# Patient Record
Sex: Male | Born: 1960 | Race: Black or African American | Hispanic: No | Marital: Single
Health system: Southern US, Community
[De-identification: ages and names within clinical notes are randomized; demographics above are authoritative.]

## PROBLEM LIST (undated history)

## (undated) DIAGNOSIS — F209 Schizophrenia, unspecified: Secondary | ICD-10-CM

## (undated) DIAGNOSIS — I10 Essential (primary) hypertension: Secondary | ICD-10-CM

## (undated) HISTORY — PX: HERNIA REPAIR: SHX51

## (undated) HISTORY — PX: APPENDECTOMY: SHX54

---

## 2011-04-13 ENCOUNTER — Encounter (HOSPITAL_COMMUNITY): Payer: Self-pay | Admitting: Pharmacy Technician

## 2011-04-14 NOTE — Progress Notes (Signed)
Called Dr. Yetta Barre office, spoke with Erie Noe, requested orders for surgery.

## 2011-04-15 ENCOUNTER — Encounter (HOSPITAL_COMMUNITY): Admission: RE | Payer: Self-pay | Source: Ambulatory Visit

## 2011-04-15 ENCOUNTER — Ambulatory Visit (HOSPITAL_COMMUNITY)
Admission: RE | Admit: 2011-04-15 | Payer: Worker's Compensation | Source: Ambulatory Visit | Admitting: Neurological Surgery

## 2011-04-15 SURGERY — POSTERIOR LUMBAR FUSION 2 LEVEL
Anesthesia: General | Site: Back

## 2011-07-20 NOTE — Progress Notes (Signed)
LM for Erie Noe re our office unable to contact pt since early April. Requested orders.

## 2011-07-21 ENCOUNTER — Encounter (HOSPITAL_COMMUNITY): Admission: RE | Payer: Self-pay | Source: Ambulatory Visit

## 2011-07-21 ENCOUNTER — Ambulatory Visit (HOSPITAL_COMMUNITY)
Admission: RE | Admit: 2011-07-21 | Payer: Worker's Compensation | Source: Ambulatory Visit | Admitting: Neurological Surgery

## 2011-07-21 SURGERY — POSTERIOR LUMBAR FUSION 2 LEVEL
Anesthesia: General | Site: Back

## 2013-01-31 ENCOUNTER — Other Ambulatory Visit: Payer: Self-pay | Admitting: Neurological Surgery

## 2013-01-31 DIAGNOSIS — M542 Cervicalgia: Secondary | ICD-10-CM

## 2013-01-31 DIAGNOSIS — M549 Dorsalgia, unspecified: Secondary | ICD-10-CM

## 2013-02-10 ENCOUNTER — Other Ambulatory Visit: Payer: Self-pay

## 2013-05-20 ENCOUNTER — Ambulatory Visit
Admission: RE | Admit: 2013-05-20 | Discharge: 2013-05-20 | Disposition: A | Discharge status: Home / Self Care | Payer: Worker's Compensation | Source: Ambulatory Visit | Attending: Neurological Surgery | Admitting: Neurological Surgery

## 2013-05-20 ENCOUNTER — Ambulatory Visit
Admission: RE | Admit: 2013-05-20 | Discharge: 2013-05-20 | Disposition: A | Discharge status: Home / Self Care | Payer: Self-pay | Source: Ambulatory Visit | Attending: Neurological Surgery | Admitting: Neurological Surgery

## 2013-05-20 DIAGNOSIS — M549 Dorsalgia, unspecified: Secondary | ICD-10-CM

## 2013-05-20 DIAGNOSIS — M542 Cervicalgia: Secondary | ICD-10-CM

## 2018-10-30 DIAGNOSIS — K3532 Acute appendicitis with perforation and localized peritonitis, without abscess: Secondary | ICD-10-CM

## 2018-10-30 DIAGNOSIS — I252 Old myocardial infarction: Secondary | ICD-10-CM

## 2019-02-28 ENCOUNTER — Inpatient Hospital Stay (HOSPITAL_COMMUNITY)
Admission: EM | Admit: 2019-02-28 | Discharge: 2019-03-22 | DRG: 663 | Disposition: A | Discharge status: Other Acute Inpatient Hospital | Payer: Self-pay | Attending: Internal Medicine | Admitting: Internal Medicine

## 2019-02-28 ENCOUNTER — Encounter (HOSPITAL_COMMUNITY): Payer: Self-pay | Admitting: *Deleted

## 2019-02-28 ENCOUNTER — Emergency Department (HOSPITAL_COMMUNITY): Payer: Self-pay

## 2019-02-28 ENCOUNTER — Other Ambulatory Visit: Payer: Self-pay

## 2019-02-28 DIAGNOSIS — F29 Unspecified psychosis not due to a substance or known physiological condition: Secondary | ICD-10-CM | POA: Diagnosis present

## 2019-02-28 DIAGNOSIS — K5900 Constipation, unspecified: Secondary | ICD-10-CM | POA: Diagnosis present

## 2019-02-28 DIAGNOSIS — T1490XA Injury, unspecified, initial encounter: Secondary | ICD-10-CM

## 2019-02-28 DIAGNOSIS — Z7289 Other problems related to lifestyle: Secondary | ICD-10-CM

## 2019-02-28 DIAGNOSIS — F200 Paranoid schizophrenia: Secondary | ICD-10-CM | POA: Diagnosis present

## 2019-02-28 DIAGNOSIS — E872 Acidosis: Secondary | ICD-10-CM | POA: Diagnosis present

## 2019-02-28 DIAGNOSIS — N138 Other obstructive and reflux uropathy: Secondary | ICD-10-CM | POA: Diagnosis present

## 2019-02-28 DIAGNOSIS — S31119A Laceration without foreign body of abdominal wall, unspecified quadrant without penetration into peritoneal cavity, initial encounter: Secondary | ICD-10-CM | POA: Diagnosis present

## 2019-02-28 DIAGNOSIS — N320 Bladder-neck obstruction: Secondary | ICD-10-CM | POA: Diagnosis present

## 2019-02-28 DIAGNOSIS — R339 Retention of urine, unspecified: Secondary | ICD-10-CM | POA: Diagnosis present

## 2019-02-28 DIAGNOSIS — K5730 Diverticulosis of large intestine without perforation or abscess without bleeding: Secondary | ICD-10-CM | POA: Diagnosis present

## 2019-02-28 DIAGNOSIS — F319 Bipolar disorder, unspecified: Secondary | ICD-10-CM | POA: Diagnosis present

## 2019-02-28 DIAGNOSIS — I10 Essential (primary) hypertension: Secondary | ICD-10-CM | POA: Diagnosis present

## 2019-02-28 DIAGNOSIS — N390 Urinary tract infection, site not specified: Secondary | ICD-10-CM | POA: Diagnosis present

## 2019-02-28 DIAGNOSIS — R21 Rash and other nonspecific skin eruption: Secondary | ICD-10-CM | POA: Diagnosis not present

## 2019-02-28 DIAGNOSIS — E162 Hypoglycemia, unspecified: Secondary | ICD-10-CM

## 2019-02-28 DIAGNOSIS — F17210 Nicotine dependence, cigarettes, uncomplicated: Secondary | ICD-10-CM | POA: Diagnosis present

## 2019-02-28 DIAGNOSIS — N401 Enlarged prostate with lower urinary tract symptoms: Secondary | ICD-10-CM | POA: Diagnosis present

## 2019-02-28 DIAGNOSIS — R451 Restlessness and agitation: Secondary | ICD-10-CM | POA: Diagnosis not present

## 2019-02-28 DIAGNOSIS — S3121XA Laceration without foreign body of penis, initial encounter: Secondary | ICD-10-CM | POA: Diagnosis present

## 2019-02-28 DIAGNOSIS — N179 Acute kidney failure, unspecified: Principal | ICD-10-CM | POA: Diagnosis present

## 2019-02-28 DIAGNOSIS — T797XXA Traumatic subcutaneous emphysema, initial encounter: Secondary | ICD-10-CM | POA: Diagnosis present

## 2019-02-28 DIAGNOSIS — X789XXA Intentional self-harm by unspecified sharp object, initial encounter: Secondary | ICD-10-CM | POA: Diagnosis present

## 2019-02-28 DIAGNOSIS — Z79899 Other long term (current) drug therapy: Secondary | ICD-10-CM

## 2019-02-28 DIAGNOSIS — K449 Diaphragmatic hernia without obstruction or gangrene: Secondary | ICD-10-CM | POA: Diagnosis present

## 2019-02-28 DIAGNOSIS — Z9049 Acquired absence of other specified parts of digestive tract: Secondary | ICD-10-CM

## 2019-02-28 DIAGNOSIS — D696 Thrombocytopenia, unspecified: Secondary | ICD-10-CM

## 2019-02-28 DIAGNOSIS — T148XXA Other injury of unspecified body region, initial encounter: Secondary | ICD-10-CM | POA: Diagnosis present

## 2019-02-28 DIAGNOSIS — Z20822 Contact with and (suspected) exposure to covid-19: Secondary | ICD-10-CM | POA: Diagnosis present

## 2019-02-28 DIAGNOSIS — R52 Pain, unspecified: Secondary | ICD-10-CM

## 2019-02-28 DIAGNOSIS — R109 Unspecified abdominal pain: Secondary | ICD-10-CM

## 2019-02-28 DIAGNOSIS — I700 Atherosclerosis of aorta: Secondary | ICD-10-CM | POA: Diagnosis present

## 2019-02-28 HISTORY — DX: Schizophrenia, unspecified: F20.9

## 2019-02-28 HISTORY — DX: Essential (primary) hypertension: I10

## 2019-02-28 LAB — COMPREHENSIVE METABOLIC PANEL
ALT: 25 U/L (ref 0–44)
AST: 39 U/L (ref 15–41)
Albumin: 3.6 g/dL (ref 3.5–5.0)
Alkaline Phosphatase: 42 U/L (ref 38–126)
Anion gap: 19 — ABNORMAL HIGH (ref 5–15)
BUN: 68 mg/dL — ABNORMAL HIGH (ref 6–20)
CO2: 12 mmol/L — ABNORMAL LOW (ref 22–32)
Calcium: 9.2 mg/dL (ref 8.9–10.3)
Chloride: 109 mmol/L (ref 98–111)
Creatinine, Ser: 9.46 mg/dL — ABNORMAL HIGH (ref 0.61–1.24)
GFR calc Af Amer: 6 mL/min — ABNORMAL LOW (ref >60)
GFR calc non Af Amer: 5 mL/min — ABNORMAL LOW (ref >60)
Glucose, Bld: 66 mg/dL — ABNORMAL LOW (ref 70–99)
Potassium: 4.2 mmol/L (ref 3.5–5.1)
Sodium: 140 mmol/L (ref 135–145)
Total Bilirubin: 0.7 mg/dL (ref 0.3–1.2)
Total Protein: 6.8 g/dL (ref 6.5–8.1)

## 2019-02-28 LAB — URINALYSIS, ROUTINE W REFLEX MICROSCOPIC
Bilirubin Urine: NEGATIVE
Glucose, UA: NEGATIVE mg/dL
Ketones, ur: NEGATIVE mg/dL
Leukocytes,Ua: NEGATIVE
Nitrite: NEGATIVE
Protein, ur: NEGATIVE mg/dL
Specific Gravity, Urine: 1.013 (ref 1.005–1.030)
pH: 5 (ref 5.0–8.0)

## 2019-02-28 LAB — CBC
HCT: 44.2 % (ref 39.0–52.0)
Hemoglobin: 14.4 g/dL (ref 13.0–17.0)
MCH: 27.5 pg (ref 26.0–34.0)
MCHC: 32.6 g/dL (ref 30.0–36.0)
MCV: 84.5 fL (ref 80.0–100.0)
Platelets: 131 10*3/uL — ABNORMAL LOW (ref 150–400)
RBC: 5.23 MIL/uL (ref 4.22–5.81)
RDW: 14.6 % (ref 11.5–15.5)
WBC: 6.3 10*3/uL (ref 4.0–10.5)
nRBC: 0 % (ref 0.0–0.2)

## 2019-02-28 LAB — RESPIRATORY PANEL BY RT PCR (FLU A&B, COVID)
Influenza A by PCR: NEGATIVE
Influenza B by PCR: NEGATIVE
SARS Coronavirus 2 by RT PCR: NEGATIVE

## 2019-02-28 LAB — PROTIME-INR
INR: 1.2 (ref 0.8–1.2)
Prothrombin Time: 14.9 seconds (ref 11.4–15.2)

## 2019-02-28 LAB — SAMPLE TO BLOOD BANK

## 2019-02-28 LAB — ACETAMINOPHEN LEVEL: Acetaminophen (Tylenol), Serum: <10 ug/mL — ABNORMAL LOW (ref 10–30)

## 2019-02-28 LAB — ETHANOL: Alcohol, Ethyl (B): <10 mg/dL (ref <10)

## 2019-02-28 LAB — CDS SEROLOGY

## 2019-02-28 LAB — SALICYLATE LEVEL: Salicylate Lvl: <7 mg/dL — ABNORMAL LOW (ref 7.0–30.0)

## 2019-02-28 MED ORDER — ACETAMINOPHEN 325 MG PO TABS
650.0000 mg | ORAL_TABLET | Freq: Four times a day (QID) | ORAL | Status: DC | PRN
  Administered 2019-03-01 – 2019-03-15 (×3): 650 mg via ORAL
  Filled 2019-02-28 (×3): qty 2

## 2019-02-28 MED ORDER — SODIUM CHLORIDE 0.9 % IV SOLN
INTRAVENOUS | Status: AC | PRN
  Administered 2019-02-28 (×2): 1000 mL via INTRAVENOUS

## 2019-02-28 MED ORDER — CEPHALEXIN 250 MG PO CAPS
500.0000 mg | ORAL_CAPSULE | Freq: Four times a day (QID) | ORAL | Status: DC
  Administered 2019-02-28: 21:00:00 500 mg via ORAL
  Filled 2019-02-28: qty 2

## 2019-02-28 MED ORDER — ACETAMINOPHEN 650 MG RE SUPP: 650.0000 mg | Freq: Four times a day (QID) | RECTAL | Status: DC | PRN

## 2019-02-28 MED ORDER — FENTANYL CITRATE (PF) 100 MCG/2ML IJ SOLN
100.0000 ug | Freq: Once | INTRAMUSCULAR | Status: AC
  Administered 2019-02-28: 100 ug via INTRAVENOUS
  Filled 2019-02-28: qty 2

## 2019-02-28 MED ORDER — SODIUM CHLORIDE 0.9 % IV SOLN: INTRAVENOUS | Status: AC

## 2019-02-28 MED ORDER — FENTANYL CITRATE (PF) 100 MCG/2ML IJ SOLN
INTRAMUSCULAR | Status: AC
  Filled 2019-02-28: qty 2

## 2019-02-28 MED ORDER — FENTANYL CITRATE (PF) 100 MCG/2ML IJ SOLN
100.0000 ug | Freq: Once | INTRAMUSCULAR | Status: AC
  Administered 2019-02-28: 100 ug via INTRAVENOUS

## 2019-02-28 MED ORDER — BUPIVACAINE HCL (PF) 0.5 % IJ SOLN
10.0000 mL | Freq: Once | INTRAMUSCULAR | Status: AC
  Administered 2019-02-28: 10 mL
  Filled 2019-02-28: qty 10

## 2019-02-28 MED ORDER — IOHEXOL 300 MG/ML  SOLN
100.0000 mL | Freq: Once | INTRAMUSCULAR | Status: AC | PRN
  Administered 2019-02-28: 100 mL via INTRAVENOUS

## 2019-02-28 MED ORDER — SODIUM CHLORIDE 0.9 % IV SOLN
1.0000 g | Freq: Every day | INTRAVENOUS | Status: DC
  Administered 2019-03-01 – 2019-03-02 (×3): 1 g via INTRAVENOUS
  Filled 2019-02-28 (×4): qty 10

## 2019-02-28 MED ORDER — TETANUS-DIPHTH-ACELL PERTUSSIS 5-2.5-18.5 LF-MCG/0.5 IM SUSP
0.5000 mL | Freq: Once | INTRAMUSCULAR | Status: AC
  Administered 2019-02-28: 0.5 mL via INTRAMUSCULAR
  Filled 2019-02-28: qty 0.5

## 2019-02-28 MED ORDER — LIDOCAINE-EPINEPHRINE (PF) 2 %-1:200000 IJ SOLN
10.0000 mL | Freq: Once | INTRAMUSCULAR | Status: AC
  Administered 2019-02-28: 10 mL
  Filled 2019-02-28: qty 20

## 2019-02-28 NOTE — ED Notes (Signed)
Portable xrays chest and abd
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
## 2019-02-28 NOTE — ED Notes (Signed)
Initial bp was  116/78

## 2019-02-28 NOTE — ED Notes (Signed)
Pt c/o the pain from the catheter
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
## 2019-02-28 NOTE — Discharge Instructions (Signed)
-  Keflex 500 mg 4 times daily for 5 days 
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
## 2019-02-28 NOTE — ED Notes (Signed)
To ct
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
## 2019-02-28 NOTE — ED Notes (Signed)
Trauma surgeon just arrived

## 2019-02-28 NOTE — ED Provider Notes (Signed)
 ..  Laceration Repair  Date/Time: 02/28/2019 7:20 PM Performed by: Sena Hitch, Student-PA Authorized by: Anselm Pancoast, PA-C   Consent:    Consent obtained:  Verbal   Consent given by:  Patient   Risks discussed:  Infection, pain, vascular damage, nerve damage, need for additional repair, poor wound healing and poor cosmetic result Anesthesia (see MAR for exact dosages):    Anesthesia method:  Local infiltration   Local anesthetic:  Bupivacaine 0.5% w/o epi and lidocaine 2% WITH epi Laceration details:    Location:  Pelvis   Pelvis location:  Pelvis   Length (cm):  7 Repair type:    Repair type:  Complex Exploration:    Wound exploration: entire depth of wound probed and visualized   Treatment:    Area cleansed with:  Betadine and saline   Amount of cleaning:  Extensive   Irrigation solution:  Sterile saline   Irrigation method:  Syringe   Debridement:  Moderate Skin repair:    Repair method:  Sutures   Suture size:  3-0   Suture material:  Plain gut   Suture technique:  Simple interrupted   Number of sutures:  3 Approximation:    Approximation:  Loose Post-procedure details:    Dressing:  Non-adherent dressing   Patient tolerance of procedure:  Tolerated well, no immediate complications       Adam Castro 02/28/19 2012    Benjiman Core, MD 02/28/19 2332

## 2019-02-28 NOTE — ED Notes (Signed)
Pt taken to CT with rn's and physicians.
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
## 2019-03-01 ENCOUNTER — Encounter (HOSPITAL_COMMUNITY): Payer: Self-pay | Admitting: Internal Medicine

## 2019-03-01 DIAGNOSIS — D696 Thrombocytopenia, unspecified: Secondary | ICD-10-CM

## 2019-03-01 LAB — COMPREHENSIVE METABOLIC PANEL
ALT: 23 U/L (ref 0–44)
AST: 32 U/L (ref 15–41)
Albumin: 3.5 g/dL (ref 3.5–5.0)
Alkaline Phosphatase: 42 U/L (ref 38–126)
Anion gap: 15 (ref 5–15)
BUN: 59 mg/dL — ABNORMAL HIGH (ref 6–20)
CO2: 18 mmol/L — ABNORMAL LOW (ref 22–32)
Calcium: 9.4 mg/dL (ref 8.9–10.3)
Chloride: 108 mmol/L (ref 98–111)
Creatinine, Ser: 4.57 mg/dL — ABNORMAL HIGH (ref 0.61–1.24)
GFR calc Af Amer: 15 mL/min — ABNORMAL LOW (ref >60)
GFR calc non Af Amer: 13 mL/min — ABNORMAL LOW (ref >60)
Glucose, Bld: 77 mg/dL (ref 70–99)
Potassium: 3.5 mmol/L (ref 3.5–5.1)
Sodium: 141 mmol/L (ref 135–145)
Total Bilirubin: 0.7 mg/dL (ref 0.3–1.2)
Total Protein: 6.8 g/dL (ref 6.5–8.1)

## 2019-03-01 LAB — RAPID URINE DRUG SCREEN, HOSP PERFORMED
Amphetamines: POSITIVE — AB
Barbiturates: NOT DETECTED
Benzodiazepines: NOT DETECTED
Cocaine: NOT DETECTED
Opiates: NOT DETECTED
Tetrahydrocannabinol: NOT DETECTED

## 2019-03-01 LAB — CBC
HCT: 45 % (ref 39.0–52.0)
Hemoglobin: 14.3 g/dL (ref 13.0–17.0)
MCH: 27.6 pg (ref 26.0–34.0)
MCHC: 31.8 g/dL (ref 30.0–36.0)
MCV: 86.7 fL (ref 80.0–100.0)
Platelets: 123 10*3/uL — ABNORMAL LOW (ref 150–400)
RBC: 5.19 MIL/uL (ref 4.22–5.81)
RDW: 14.7 % (ref 11.5–15.5)
WBC: 6.4 10*3/uL (ref 4.0–10.5)
nRBC: 0 % (ref 0.0–0.2)

## 2019-03-01 LAB — PROTEIN / CREATININE RATIO, URINE
Creatinine, Urine: 86.95 mg/dL
Protein Creatinine Ratio: 0.14 mg/mg{Cre} (ref 0.00–0.15)
Total Protein, Urine: 12 mg/dL

## 2019-03-01 LAB — HIV ANTIBODY (ROUTINE TESTING W REFLEX): HIV Screen 4th Generation wRfx: NONREACTIVE

## 2019-03-01 LAB — SODIUM, URINE, RANDOM: Sodium, Ur: 104 mmol/L

## 2019-03-01 MED ORDER — DIPHENHYDRAMINE HCL 50 MG/ML IJ SOLN
25.0000 mg | Freq: Once | INTRAMUSCULAR | Status: AC
  Administered 2019-03-01: 25 mg via INTRAVENOUS

## 2019-03-01 MED ORDER — HALOPERIDOL LACTATE 5 MG/ML IJ SOLN
5.0000 mg | Freq: Once | INTRAMUSCULAR | Status: AC
  Administered 2019-03-06: 5 mg via INTRAVENOUS
  Filled 2019-03-01: qty 1

## 2019-03-01 MED ORDER — DIPHENHYDRAMINE HCL 50 MG/ML IJ SOLN
INTRAMUSCULAR | Status: AC
  Filled 2019-03-01: qty 1

## 2019-03-01 MED ORDER — HYDROCODONE-ACETAMINOPHEN 5-325 MG PO TABS
1.0000 | ORAL_TABLET | Freq: Four times a day (QID) | ORAL | Status: DC | PRN
  Administered 2019-03-01 – 2019-03-02 (×3): 2 via ORAL
  Filled 2019-03-01 (×3): qty 2

## 2019-03-01 MED ORDER — LORAZEPAM 2 MG/ML IJ SOLN
INTRAMUSCULAR | Status: AC
  Filled 2019-03-01: qty 1

## 2019-03-01 MED ORDER — HALOPERIDOL LACTATE 5 MG/ML IJ SOLN
5.0000 mg | Freq: Once | INTRAMUSCULAR | Status: AC
  Administered 2019-03-01: 5 mg via INTRAVENOUS
  Filled 2019-03-01: qty 1

## 2019-03-01 MED ORDER — CHLORHEXIDINE GLUCONATE CLOTH 2 % EX PADS
6.0000 | MEDICATED_PAD | Freq: Every day | CUTANEOUS | Status: DC
  Administered 2019-03-02 – 2019-03-15 (×11): 6 via TOPICAL

## 2019-03-01 MED ORDER — LORAZEPAM 2 MG/ML IJ SOLN
2.0000 mg | Freq: Once | INTRAMUSCULAR | Status: AC
  Administered 2019-03-01: 2 mg via INTRAVENOUS

## 2019-03-01 MED ORDER — STERILE WATER FOR INJECTION IV SOLN
INTRAVENOUS | Status: DC
  Filled 2019-03-01 (×2): qty 850

## 2019-03-01 NOTE — ED Notes (Signed)
Called staffing to see if sitter available, no sitter in house.

## 2019-03-01 NOTE — ED Notes (Signed)
Pt awake and more alert and upright eating lunch.  Report has been called to 6E and waiting designation of where he will be admitted.
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
## 2019-03-01 NOTE — ED Notes (Signed)
Pt has remained drowsy and unable to stay awake to eat breakfast despite set up for him.  TTS now in process with pt remaining drowsy with poor concentration and slurred speech.  Able to redirect briefly and is calm.

## 2019-03-01 NOTE — ED Notes (Signed)
MD in with pt at this time.  Has dozed off while eating, but wakes for assessment.  Calm without gross agitation.

## 2019-03-01 NOTE — ED Notes (Signed)
Pt accepted medication willingly
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
## 2019-03-01 NOTE — ED Notes (Signed)
Paged admitting MD for extension order for violent restraints

## 2019-03-01 NOTE — ED Notes (Signed)
Pt hallucinating stating that he is seeing "bugs" on his penis and told this RN "to catch them". Told pt he did not have any bugs on him and he stated, "you guys just think I'm crazy." Pt also got agitated because he "had something dirty" in his IV and ended up pulling it out. Orders for meds requested

## 2019-03-01 NOTE — ED Notes (Addendum)
Restraints removed at this time.  Pt sleeping and able to move freely in bed.  Will continue to monitor for changes.
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
## 2019-03-01 NOTE — ED Notes (Signed)
Pt will continue to go to 6 E oer bed placement.

## 2019-03-01 NOTE — ED Notes (Signed)
PA at bedside repairing wound.

## 2019-03-02 LAB — BASIC METABOLIC PANEL
Anion gap: 9 (ref 5–15)
BUN: 37 mg/dL — ABNORMAL HIGH (ref 6–20)
CO2: 25 mmol/L (ref 22–32)
Calcium: 8.3 mg/dL — ABNORMAL LOW (ref 8.9–10.3)
Chloride: 102 mmol/L (ref 98–111)
Creatinine, Ser: 1.13 mg/dL (ref 0.61–1.24)
GFR calc Af Amer: >60 mL/min (ref >60)
GFR calc non Af Amer: >60 mL/min (ref >60)
Glucose, Bld: 113 mg/dL — ABNORMAL HIGH (ref 70–99)
Potassium: 3.5 mmol/L (ref 3.5–5.1)
Sodium: 136 mmol/L (ref 135–145)

## 2019-03-02 LAB — PROTEIN ELECTROPHORESIS, SERUM
A/G Ratio: 1 (ref 0.7–1.7)
Albumin ELP: 3.3 g/dL (ref 2.9–4.4)
Alpha-1-Globulin: 0.2 g/dL (ref 0.0–0.4)
Alpha-2-Globulin: 0.7 g/dL (ref 0.4–1.0)
Beta Globulin: 1.1 g/dL (ref 0.7–1.3)
Gamma Globulin: 1.3 g/dL (ref 0.4–1.8)
Globulin, Total: 3.3 g/dL (ref 2.2–3.9)
Total Protein ELP: 6.6 g/dL (ref 6.0–8.5)

## 2019-03-02 LAB — PSA, SERUM (SERIAL MONITOR): Prostate Specific Ag, Serum: 4 ng/mL (ref 0.0–4.0)

## 2019-03-02 LAB — CYTOLOGY - NON PAP

## 2019-03-02 MED ORDER — SENNOSIDES-DOCUSATE SODIUM 8.6-50 MG PO TABS
1.0000 | ORAL_TABLET | Freq: Every evening | ORAL | Status: DC | PRN
  Administered 2019-03-02: 2 via ORAL
  Filled 2019-03-02: qty 2

## 2019-03-02 MED ORDER — OXYCODONE HCL 5 MG PO TABS
10.0000 mg | ORAL_TABLET | ORAL | Status: DC | PRN
  Administered 2019-03-02 – 2019-03-03 (×6): 15 mg via ORAL
  Administered 2019-03-03: 10 mg via ORAL
  Administered 2019-03-04 – 2019-03-09 (×27): 15 mg via ORAL
  Administered 2019-03-09: 12:00:00 10 mg via ORAL
  Administered 2019-03-09 – 2019-03-11 (×12): 15 mg via ORAL
  Administered 2019-03-11: 10 mg via ORAL
  Administered 2019-03-11 – 2019-03-16 (×23): 15 mg via ORAL
  Administered 2019-03-16: 10 mg via ORAL
  Administered 2019-03-16 – 2019-03-17 (×6): 15 mg via ORAL
  Administered 2019-03-17: 10 mg via ORAL
  Administered 2019-03-17 – 2019-03-22 (×26): 15 mg via ORAL
  Filled 2019-03-02 (×26): qty 3
  Filled 2019-03-02 (×2): qty 2
  Filled 2019-03-02 (×41): qty 3
  Filled 2019-03-02: qty 2
  Filled 2019-03-02 (×35): qty 3
  Filled 2019-03-02: qty 2

## 2019-03-02 NOTE — Progress Notes (Signed)
Pt gave me permission to speak with sister about his condition. She stated that he does not have a history of any psych issues that she is aware of. She did state that she knew that he had a history of substance abuse, but has not seen him in many months, so is unsure what he is doing at present. Pt stated to me that he had not been urinating except very small amounts for about 4 days prior to coming in. He stated he quit drinking fluids because it was so difficult for him to urinate. Pt has been very appropriate today, alert and oriented as well. He complained of being constipated for a week prev to coming in. Paged Dr. Gerri Lins to request something for constipation. Emelda Brothers RN
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
## 2019-03-02 NOTE — Social Work (Signed)
CSW spoke with [facility], they request to be notified when pt stable for discharge. Pt under IVC, this is noted as uploaded and served on 1/6.   Pt not medically stable at this time.  Secure message sent to bedside RN Neysa Bonito in regards to law enforcement. They must submit an order to disclose to our medical records prior to getting any updates regarding pt status or stability.   Octavio Graves, MSW, LCSWA [facility] Social Work
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
## 2019-03-03 LAB — CBC WITH DIFFERENTIAL/PLATELET
Abs Immature Granulocytes: 0 10*3/uL (ref 0.00–0.07)
Basophils Absolute: 0 10*3/uL (ref 0.0–0.1)
Basophils Relative: 1 %
Eosinophils Absolute: 0.2 10*3/uL (ref 0.0–0.5)
Eosinophils Relative: 5 %
HCT: 40.3 % (ref 39.0–52.0)
Hemoglobin: 13.2 g/dL (ref 13.0–17.0)
Immature Granulocytes: 0 %
Lymphocytes Relative: 40 %
Lymphs Abs: 1.4 10*3/uL (ref 0.7–4.0)
MCH: 27.4 pg (ref 26.0–34.0)
MCHC: 32.8 g/dL (ref 30.0–36.0)
MCV: 83.8 fL (ref 80.0–100.0)
Monocytes Absolute: 0.5 10*3/uL (ref 0.1–1.0)
Monocytes Relative: 14 %
Neutro Abs: 1.5 10*3/uL — ABNORMAL LOW (ref 1.7–7.7)
Neutrophils Relative %: 40 %
Platelets: 154 10*3/uL (ref 150–400)
RBC: 4.81 MIL/uL (ref 4.22–5.81)
RDW: 14.1 % (ref 11.5–15.5)
WBC: 3.6 10*3/uL — ABNORMAL LOW (ref 4.0–10.5)
nRBC: 0 % (ref 0.0–0.2)

## 2019-03-03 LAB — BASIC METABOLIC PANEL
Anion gap: 11 (ref 5–15)
BUN: 19 mg/dL (ref 6–20)
CO2: 26 mmol/L (ref 22–32)
Calcium: 8.1 mg/dL — ABNORMAL LOW (ref 8.9–10.3)
Chloride: 100 mmol/L (ref 98–111)
Creatinine, Ser: 0.81 mg/dL (ref 0.61–1.24)
GFR calc Af Amer: >60 mL/min (ref >60)
GFR calc non Af Amer: >60 mL/min (ref >60)
Glucose, Bld: 104 mg/dL — ABNORMAL HIGH (ref 70–99)
Potassium: 3.6 mmol/L (ref 3.5–5.1)
Sodium: 137 mmol/L (ref 135–145)

## 2019-03-03 MED ORDER — OXYCODONE HCL 5 MG PO TABS: 5.0000 mg | ORAL_TABLET | ORAL | 0 refills | Status: AC | PRN

## 2019-03-03 MED ORDER — METOPROLOL TARTRATE 25 MG PO TABS: 25.0000 mg | ORAL_TABLET | Freq: Two times a day (BID) | ORAL | 0 refills | Status: DC

## 2019-03-03 MED ORDER — METOPROLOL TARTRATE 25 MG PO TABS
25.0000 mg | ORAL_TABLET | Freq: Two times a day (BID) | ORAL | Status: DC
  Administered 2019-03-03 – 2019-03-22 (×40): 25 mg via ORAL
  Filled 2019-03-03 (×40): qty 1

## 2019-03-03 MED ORDER — LACTULOSE 10 GM/15ML PO SOLN
20.0000 g | Freq: Three times a day (TID) | ORAL | Status: DC
  Administered 2019-03-03 – 2019-03-04 (×4): 20 g via ORAL
  Filled 2019-03-03 (×5): qty 30

## 2019-03-03 MED ORDER — CEPHALEXIN 500 MG PO CAPS: 500.0000 mg | ORAL_CAPSULE | Freq: Four times a day (QID) | ORAL | 0 refills | Status: DC

## 2019-03-03 MED ORDER — CEPHALEXIN 500 MG PO CAPS
500.0000 mg | ORAL_CAPSULE | Freq: Three times a day (TID) | ORAL | Status: DC
  Administered 2019-03-03 – 2019-03-06 (×10): 500 mg via ORAL
  Filled 2019-03-03 (×10): qty 1

## 2019-03-03 MED ORDER — LACTULOSE 20 GM/30ML PO SOLN: 20.0000 g | Freq: Three times a day (TID) | ORAL | 0 refills | Status: DC
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
## 2019-03-03 NOTE — Progress Notes (Signed)
[employer] are here to transport pt.  RN called report to [phone], Charity fundraiser.  [facility] RN stated they cant take any pt with foley, confirmed with [facility] AC, Christ Judge [phone]. Swayze, MD made aware.

## 2019-03-03 NOTE — Progress Notes (Addendum)
Swayze, MD called back in regard to pt's foley. Received verbal order to DC foley and void trial in the hope to transfer pt to [facility] tomorrow.  Oncoming RN and chart RN made aware of new order.

## 2019-03-03 NOTE — Progress Notes (Signed)
Per Lamount Cranker, [facility], pt has been accepted to [facility] bed [phone]. Accepting provider is Berneice Heinrich NP, Attending provider is Dr. Nehemiah Massed MD. Patient can arrive after 6:00PM today, 1/9. Number for report is [phone]. CSW relayed the above disposition information to Judd Lien [facility] CM/SW and provided her with the police dept number for IVC transport [phone]).   Trula Slade, MSW, LCSW Clinical Social Worker 03/03/2019 1:18 PM
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
## 2019-03-03 NOTE — Progress Notes (Addendum)
CSW was alerted that patient was medically stable for discharge. CSW spoke with Burnetta Sabin at [facility] and she will let CSW know when patient's bed is ready. CSW alerted RN of updates.  CSW received a call back from [facility] and they stated that they could admit patient after 6pm. CSW will call [employer] to arrange transport. CSW alerted RN and [facility] SW will write a note that has the call to report and patient's room number.  CSW will continue to follow for discharge planning needs.

## 2019-03-04 MED ORDER — TAMSULOSIN HCL 0.4 MG PO CAPS: 0.4000 mg | ORAL_CAPSULE | Freq: Every day | ORAL | 0 refills | Status: DC

## 2019-03-04 MED ORDER — TAMSULOSIN HCL 0.4 MG PO CAPS
0.4000 mg | ORAL_CAPSULE | Freq: Every day | ORAL | Status: DC
  Administered 2019-03-04: 0.4 mg via ORAL
  Filled 2019-03-04: qty 1

## 2019-03-04 MED ORDER — LIDOCAINE HCL URETHRAL/MUCOSAL 2 % EX GEL
1.0000 "application " | Freq: Once | CUTANEOUS | Status: DC
  Filled 2019-03-04 (×2): qty 20

## 2019-03-04 NOTE — Progress Notes (Signed)
RN attempted an I & O cath on pt. Pt's bladder scan showed 324 ml. Pt has had his foley out since 0920. The I&O cath was inserted & nothing came out. Per pt he does have prostate issues. MD notified & there was a new order placed for a coude cath. Pt also hasn't had much of a BM since 1/2 only a small smear 1/10 despite taking lactulose & drinking prune juice 3 times in the past 12 hours. New orders for soap suds enema. Will continue to monitor the pt. Sanda Linger, RN

## 2019-03-04 NOTE — Progress Notes (Signed)
RN spoke with Rana Snare, NP (on call with [facility]).  RN concerned about removing patient's foley tonight due to foley being inserted by surgery MD and urology was consulted and per urology foley was to remain in place with outpatient follow up in about a week.  Per [place] do not remove foley tonight and follow up with day MD tomorrow morning.

## 2019-03-04 NOTE — Progress Notes (Signed)
CSW was alerted that patient's foley was removed and we are just waiting for him to urinate. CSW followed up with [facility] unit to ascertain if it was possible to discharge back there  today. CSW spike with Thayer Ohm at [facility] and she informed CSW that she would look into bed availability and get back to CSW.

## 2019-03-05 MED ORDER — TAMSULOSIN HCL 0.4 MG PO CAPS
0.8000 mg | ORAL_CAPSULE | Freq: Every day | ORAL | Status: DC
  Administered 2019-03-05 – 2019-03-22 (×18): 0.8 mg via ORAL
  Filled 2019-03-05 (×18): qty 2

## 2019-03-05 MED ORDER — POLYETHYLENE GLYCOL 3350 17 G PO PACK
17.0000 g | PACK | Freq: Two times a day (BID) | ORAL | Status: DC
  Administered 2019-03-05 – 2019-03-22 (×35): 17 g via ORAL
  Filled 2019-03-05 (×34): qty 1

## 2019-03-05 NOTE — Plan of Care (Signed)
  Problem: Activity: Goal: Risk for activity intolerance will decrease Outcome: Progressing   Problem: Nutrition: Goal: Adequate nutrition will be maintained Outcome: Progressing   Problem: Coping: Goal: Level of anxiety will decrease Outcome: Progressing   

## 2019-03-05 NOTE — TOC Progression Note (Signed)
Transition of Care [facility]) - Progression Note    Patient Details  Name: Adam Castro MRN: [id number] Date of Birth: 1961-01-18  Transition of Care [facility]) CM/SW [place], [place] Phone Number: 03/05/2019, 4:18 PM  Clinical Narrative:     CSW met with pt at bedside to give resources for substance abuse.  Pt  Stated "he followed other people who use voodo" but he does not have a issue.  He stated that he will review information.  CSW will continue to follow for disposition.       Expected Discharge Plan and Services           Expected Discharge Date: 03/03/19                                     Social Determinants of Health (SDOH) Interventions    Readmission Risk Interventions No flowsheet data found.

## 2019-03-05 NOTE — Progress Notes (Signed)
PROGRESS NOTE  Adam Castro YDX:[id number] DOB: 17-Mar-1960 DOA: 02/28/2019 PCP: Patient, No Pcp Per  Brief History   Adam Castro  is a 59 y.o. male,  w ? schizophrenia / bipolar, hypertension, apparently presents from home after stabbing himself. Pt was seen by Trauma, and Urology.   The patient has been seen by psychiatry. He will be accepted to inpatient behavioral health after he is medically cleared. He apparently stabbed himself in the penis to rid himself of demons. Foley has been placed. Laceration has been closed with 3 sutures. There is no urological injury at this time per urology. No hydronephrosis is noted, although there is some bladder outlet obstruction due to BPH. He was cleared by trauma surgery. Nephrology has been consulted.  The patient's wound has been evaluated by wound care. He has had no fevers, his creatinine has normalized, and he has no leukocytosis. He is medically cleared to go to inpatient psych.  As per the recommendation of Dr. Suzanne Boron Castro the patient's foley catheter was removed and a voiding trial was performed on 03/04/2019. The patient was unable to urinate without foley catheter and it had to be replaced. I have discussed the patient with Dr. Lovena Castro. He recommended increasing Flomax to 0.8 daily and try again in a couple of days.  The patient will be discharged to inpatient psychiatry when the patient has urinated without a foley catheter, and a bed is availalble.  Consultants  . Urology . Trauma surgery . Nephrology . Psychiatry  Procedures  . Suture of laceration to pelvis.  Antibiotics   Anti-infectives (From admission, onward)   Start     Dose/Rate Route Frequency Ordered Stop   03/03/19 1645  cephALEXin (KEFLEX) capsule 500 mg     500 mg Oral Every 8 hours 03/03/19 1633     03/03/19 0000  cephALEXin (KEFLEX) 500 MG capsule  Status:  Discontinued     500 mg Oral 4 times daily 03/03/19 1015 03/03/19    02/28/19 2330  cefTRIAXone (ROCEPHIN) 1 g  in sodium chloride 0.9 % 100 mL IVPB  Status:  Discontinued     1 g 200 mL/hr over 30 Minutes Intravenous Daily at bedtime 02/28/19 2254 03/03/19 1633   02/28/19 2045  cephALEXin (KEFLEX) capsule 500 mg  Status:  Discontinued     500 mg Oral Every 6 hours 02/28/19 2037 02/28/19 2253      Subjective  The patient is resting quietly. He has no new complaints.  Objective   Vitals:  Vitals:   03/05/19 0931 03/05/19 0936  BP: (!) 120/91 (!) 120/91  Pulse: 96 98  Resp:  18  Temp:  98.1 F (36.7 C)  SpO2:  99%   Exam:  Constitutional:  . The patient is awake, alert, and oriented x 3. No acute distress. Respiratory:  . No increased work of breathing. . No wheezes, rales, or rhonchi . No tactile fremitus Cardiovascular:  . Regular rate and rhythm . No murmurs, ectopy, or gallups. . No lateral PMI. No thrills. Abdomen:  . Abdomen is soft, non-tender, non-distended . No hernias, masses, or organomegaly . Normoactive bowel sounds.  Musculoskeletal:  . No cyanosis, clubbing, or edema Skin:  . No rashes, lesions, ulcers . palpation of skin: no induration or nodules . Sutures and wound have some bloody oozing. Otherwise, no purulence. Neurologic:  . Pt is awake, alert, and oriented x 3. He is moving all extremities. Psychiatric:  . Flat affect. Mood congruent.  I have personally reviewed  the following:   Today's Data  . Vitals, BMP, CBC  Scheduled Meds: . cephALEXin  500 mg Oral Q8H  . Chlorhexidine Gluconate Cloth  6 each Topical Daily  . haloperidol lactate  5 mg Intravenous Once  . lidocaine  1 application Urethral Once  . metoprolol tartrate  25 mg Oral BID  . polyethylene glycol  17 g Oral BID  . tamsulosin  0.8 mg Oral QPC supper   Continuous Infusions:   Principal Problem:   ARF (acute renal failure) (HCC) Active Problems:   Stab wound   Psychosis (HCC)   Acute lower UTI   Thrombocytopenia (HCC)   Hypoglycemia   LOS: 5 days   A & P  Laceration to  Base of penis/pelvis: Wound closed by penis. Foley catheter placed as the patient was unable to pass urine. No true urological injury per urology. Appreciate urology input. He will need to have sutures removed between 03/08/2019 and 03/10/2019 per wound care. Wound care has left instructions for care of the wound.   Psychosis: Pt stabbed himself in an attempt to let the demons out. He is IVC'd and has been evaluated by psychiatry. He will go to inpatient psych once he is medically cleared. Safety restraints as necessary.  Acute renal failure: Improved. Creatinine is normalized.   Acute lower uti: Urine culture. Rocephin has been converted to oral Keflex.  Hypoglycemia: Improved. Monitor.   Bladder outlet obstruction likely due to BPH: Foley catheter removed, but patient was unable to urinate on his own. Foley catheter had to be replaced. I have discussed the patient with Dr. Lovena Castro. He recommended increasing the dose of Flomax to 0.8 mg daily. Inpatient psychiatry cannot take the patient with a foley catheter in place.b   Constipation: Oral lactulose, enema and lactulose.  I have seen and examined this patient myself. I have spent 35 minutes in his evaluation and care.  DVT Prophylaxis:   SCDs  CODE STATUS: Full Code.  Family Communication: None available. Disposition: Inpatient psychiatry when a bed is avaialable. Adam Eslick, DO [facility] Direct contact: see [email or web address]  7PM-7AM contact night coverage as above 03/05/2019, 4:31 PM  LOS: 1 day

## 2019-03-05 NOTE — Progress Notes (Addendum)
PROGRESS NOTE  AUGUSTEN Castro WUX:[id number] DOB: 19-Feb-1961 DOA: 02/28/2019 PCP: Patient, No Pcp Per  Brief History   Adam Castro  is a 59 y.o. male,  w ? schizophrenia / bipolar, hypertension, apparently presents from home after stabbing himself. Pt was seen by Trauma, and Urology.   The patient has been seen by psychiatry. He will be accepted to inpatient behavioral health after he is medically cleared. He apparently stabbed himself in the penis to rid himself of demons. Foley has been placed. Laceration has been closed with 3 sutures. There is no urological injury at this time per urology. No hydronephrosis is noted, although there is some bladder outlet obstruction due to BPH. He was cleared by trauma surgery. Nephrology has been consulted.  The patient's wound has been evaluated by wound care. He has had no fevers, his creatinine has normalized, and he has no leukocytosis. He is medically cleared to go to inpatient psych.  The patient will be discharged to inpatient psychiatry when the patient has had a bowel movement, he has urinated without a foley catheter, and a bed is availalble.  Consultants  . Urology . Trauma surgery . Nephrology . Psychiatry  Procedures  . Suture of laceration to pelvis.  Antibiotics   Anti-infectives (From admission, onward)   Start     Dose/Rate Route Frequency Ordered Stop   03/03/19 1645  cephALEXin (KEFLEX) capsule 500 mg     500 mg Oral Every 8 hours 03/03/19 1633     03/03/19 0000  cephALEXin (KEFLEX) 500 MG capsule  Status:  Discontinued     500 mg Oral 4 times daily 03/03/19 1015 03/03/19    02/28/19 2330  cefTRIAXone (ROCEPHIN) 1 g in sodium chloride 0.9 % 100 mL IVPB  Status:  Discontinued     1 g 200 mL/hr over 30 Minutes Intravenous Daily at bedtime 02/28/19 2254 03/03/19 1633   02/28/19 2045  cephALEXin (KEFLEX) capsule 500 mg  Status:  Discontinued     500 mg Oral Every 6 hours 02/28/19 2037 02/28/19 2253      Subjective  The  patient is resting quietly. He has no new complaints.  Objective   Vitals:  Vitals:   03/05/19 0931 03/05/19 0936  BP: (!) 120/91 (!) 120/91  Pulse: 96 98  Resp:  18  Temp:  98.1 F (36.7 C)  SpO2:  99%   Exam:  Constitutional:  . The patient is awake, alert, and oriented x 3. No acute distress. Respiratory:  . No increased work of breathing. . No wheezes, rales, or rhonchi . No tactile fremitus Cardiovascular:  . Regular rate and rhythm . No murmurs, ectopy, or gallups. . No lateral PMI. No thrills. Abdomen:  . Abdomen is soft, non-tender, non-distended . No hernias, masses, or organomegaly . Normoactive bowel sounds.  Musculoskeletal:  . No cyanosis, clubbing, or edema Skin:  . No rashes, lesions, ulcers . palpation of skin: no induration or nodules . Sutures and wound have some bloody oozing. Otherwise, no purulence. Neurologic:  . Pt is awake, alert, and oriented x 3. He is moving all extremities. Psychiatric:  . Flat affect. Mood congruent.  I have personally reviewed the following:   Today's Data  . Vitals, BMP, CBC  Scheduled Meds: . cephALEXin  500 mg Oral Q8H  . Chlorhexidine Gluconate Cloth  6 each Topical Daily  . haloperidol lactate  5 mg Intravenous Once  . lidocaine  1 application Urethral Once  . metoprolol tartrate  25 mg  Oral BID  . polyethylene glycol  17 g Oral BID  . tamsulosin  0.8 mg Oral QPC supper   Continuous Infusions:   Principal Problem:   ARF (acute renal failure) (HCC) Active Problems:   Stab wound   Psychosis (HCC)   Acute lower UTI   Thrombocytopenia (HCC)   Hypoglycemia   LOS: 5 days   A & P  Laceration to Base of penis/pelvis: Wound closed by penis. Foley catheter placed as the patient was unable to pass urine. No true urological injury per urology. Appreciate urology input. He will need to have sutures removed between 03/08/2019 and 03/10/2019 per wound care. Wound care has left instructions for care of the wound.     Psychosis: Pt stabbed himself in an attempt to let the demons out. He is IVC'd and has been evaluated by psychiatry. He will go to inpatient psych once he is medically cleared. Safety restraints as necessary.  Acute renal failure: Improved. Creatinine is normalized.   Acute lower uti: Urine culture. Rocephin has been converted to oral Keflex.  Hypoglycemia: Improved. Monitor.   Bladder outlet obstruction likely due to BPH: Foley catheter removed. Voiding trial ordered.   Constipation: Oral lactulose.   I have seen and examined this patient myself. I have spent 32 minutes in his evaluation and care.  DVT Prophylaxis:   SCDs  CODE STATUS: Full Code.  Family Communication: None available. Disposition: Inpatient psychiatry when a bed is avaialable. Oma Alpert, DO [facility] Direct contact: see [email or web address]  7PM-7AM contact night coverage as above 03/04/2019, 3:56 PM  LOS: 1 day

## 2019-03-06 LAB — CBC WITH DIFFERENTIAL/PLATELET
Abs Immature Granulocytes: 0.01 10*3/uL (ref 0.00–0.07)
Basophils Absolute: 0 10*3/uL (ref 0.0–0.1)
Basophils Relative: 1 %
Eosinophils Absolute: 0.2 10*3/uL (ref 0.0–0.5)
Eosinophils Relative: 5 %
HCT: 46.4 % (ref 39.0–52.0)
Hemoglobin: 14.5 g/dL (ref 13.0–17.0)
Immature Granulocytes: 0 %
Lymphocytes Relative: 36 %
Lymphs Abs: 1.3 10*3/uL (ref 0.7–4.0)
MCH: 27.1 pg (ref 26.0–34.0)
MCHC: 31.3 g/dL (ref 30.0–36.0)
MCV: 86.7 fL (ref 80.0–100.0)
Monocytes Absolute: 0.5 10*3/uL (ref 0.1–1.0)
Monocytes Relative: 13 %
Neutro Abs: 1.7 10*3/uL (ref 1.7–7.7)
Neutrophils Relative %: 45 %
Platelets: 200 10*3/uL (ref 150–400)
RBC: 5.35 MIL/uL (ref 4.22–5.81)
RDW: 13.8 % (ref 11.5–15.5)
WBC: 3.7 10*3/uL — ABNORMAL LOW (ref 4.0–10.5)
nRBC: 0 % (ref 0.0–0.2)

## 2019-03-06 LAB — BASIC METABOLIC PANEL
Anion gap: 11 (ref 5–15)
BUN: 10 mg/dL (ref 6–20)
CO2: 24 mmol/L (ref 22–32)
Calcium: 8.9 mg/dL (ref 8.9–10.3)
Chloride: 103 mmol/L (ref 98–111)
Creatinine, Ser: 0.78 mg/dL (ref 0.61–1.24)
GFR calc Af Amer: >60 mL/min (ref >60)
GFR calc non Af Amer: >60 mL/min (ref >60)
Glucose, Bld: 130 mg/dL — ABNORMAL HIGH (ref 70–99)
Potassium: 4.1 mmol/L (ref 3.5–5.1)
Sodium: 138 mmol/L (ref 135–145)

## 2019-03-06 MED ORDER — TAMSULOSIN HCL 0.4 MG PO CAPS: 0.8000 mg | ORAL_CAPSULE | Freq: Every day | ORAL | 0 refills | Status: DC

## 2019-03-06 NOTE — TOC Progression Note (Signed)
Transition of Care [facility]) - Progression Note    Patient Details  Name: Adam Castro MRN: [id number] Date of Birth: May 12, 1960  Transition of [facility] CM/SW Contact  Levada Schilling Phone Number: 03/06/2019, 11:20 AM  Clinical Narrative:     CSW contacted [facility] to seek availability of beds. At this time no beds are available.  [facility] will contact  CSW if they are able to accept pt.  CSW will continue to follow for disposition.       Expected Discharge Plan and Services           Expected Discharge Date: 03/03/19                                     Social Determinants of Health (SDOH) Interventions    Readmission Risk Interventions No flowsheet data found.

## 2019-03-06 NOTE — Progress Notes (Signed)
PROGRESS NOTE  WALEED DETTMAN PZW:[id number] DOB: 12/18/1960 DOA: 02/28/2019 PCP: Patient, No Pcp Per  Brief History   Kaizer Dissinger  is a 59 y.o. male,  w ? schizophrenia / bipolar, hypertension, apparently presents from home after stabbing himself. Pt was seen by Trauma, and Urology.   The patient has been seen by psychiatry. He will be accepted to inpatient behavioral health after he is medically cleared. He apparently stabbed himself in the penis to rid himself of demons. Foley has been placed. Laceration has been closed with 3 sutures. There is no urological injury at this time per urology. No hydronephrosis is noted, although there is some bladder outlet obstruction due to BPH. He was cleared by trauma surgery. Nephrology has been consulted.  The patient's wound has been evaluated by wound care. He has had no fevers, his creatinine has normalized, and he has no leukocytosis. He is medically cleared to go to inpatient psych.  As per the recommendation of Dr. Suzanne Boron Diarmid the patient's foley catheter was removed and a voiding trial was performed on 03/04/2019. The patient was unable to urinate without foley catheter and it had to be replaced. I have discussed the patient with Dr. Lovena Neighbours. He recommended increasing Flomax to 0.8 daily and try again in a couple of days.  The patient will be discharged to inpatient psychiatry when the patient has urinated without a foley catheter, and a bed is availalble.  Consultants  . Urology . Trauma surgery . Nephrology . Psychiatry  Procedures  . Suture of laceration to pelvis.  Antibiotics   Anti-infectives (From admission, onward)   Start     Dose/Rate Route Frequency Ordered Stop   03/03/19 1645  cephALEXin (KEFLEX) capsule 500 mg     500 mg Oral Every 8 hours 03/03/19 1633 03/06/19 2359   03/03/19 0000  cephALEXin (KEFLEX) 500 MG capsule  Status:  Discontinued     500 mg Oral 4 times daily 03/03/19 1015 03/03/19    02/28/19 2330  cefTRIAXone  (ROCEPHIN) 1 g in sodium chloride 0.9 % 100 mL IVPB  Status:  Discontinued     1 g 200 mL/hr over 30 Minutes Intravenous Daily at bedtime 02/28/19 2254 03/03/19 1633   02/28/19 2045  cephALEXin (KEFLEX) capsule 500 mg  Status:  Discontinued     500 mg Oral Every 6 hours 02/28/19 2037 02/28/19 2253      Subjective  The patient is resting quietly. He has no new complaints.  Objective   Vitals:  Vitals:   03/06/19 0744 03/06/19 0906  BP: (!) 128/102   Pulse: 96 (!) 103  Resp: 18   Temp: 98.5 F (36.9 C)   SpO2: 98%    Exam:  Constitutional:  . The patient is awake, alert, and oriented x 3. No acute distress. Respiratory:  . No increased work of breathing. . No wheezes, rales, or rhonchi . No tactile fremitus Cardiovascular:  . Regular rate and rhythm . No murmurs, ectopy, or gallups. . No lateral PMI. No thrills. Abdomen:  . Abdomen is soft, non-tender, non-distended . No hernias, masses, or organomegaly . Normoactive bowel sounds.  Musculoskeletal:  . No cyanosis, clubbing, or edema Skin:  . No rashes, lesions, ulcers . palpation of skin: no induration or nodules . Sutures and wound have some bloody oozing. Otherwise, no purulence. Neurologic:  . Pt is awake, alert, and oriented x 3. He is moving all extremities. Psychiatric:  . Flat affect. Mood congruent.  I have personally reviewed  the following:   Today's Data  . Vitals, BMP, CBC  Scheduled Meds: . cephALEXin  500 mg Oral Q8H  . Chlorhexidine Gluconate Cloth  6 each Topical Daily  . haloperidol lactate  5 mg Intravenous Once  . lidocaine  1 application Urethral Once  . metoprolol tartrate  25 mg Oral BID  . polyethylene glycol  17 g Oral BID  . tamsulosin  0.8 mg Oral QPC supper   Continuous Infusions:   Principal Problem:   ARF (acute renal failure) (HCC) Active Problems:   Stab wound   Psychosis (HCC)   Acute lower UTI   Thrombocytopenia (HCC)   Hypoglycemia   LOS: 6 days   A & P   Laceration to Base of penis/pelvis: Wound closed by penis. Foley catheter placed as the patient was unable to pass urine. No true urological injury per urology. Appreciate urology input. He will need to have sutures removed between 03/08/2019 and 03/10/2019 per wound care. Wound care has left instructions for care of the wound.   Psychosis: Pt stabbed himself in an attempt to let the demons out. He is IVC'd and has been evaluated by psychiatry. He will go to inpatient psych once he is medically cleared. Safety restraints as necessary.  Acute renal failure: Improved. Creatinine is normalized.   Acute lower uti: Urine culture. Treatment completed.  Hypoglycemia: Improved. Monitor.   Bladder outlet obstruction likely due to BPH: Foley catheter removed, but patient was unable to urinate on his own. Foley catheter had to be replaced. I have discussed the patient with Dr. Lovena Neighbours. He recommended increasing the dose of Flomax to 0.8 mg daily. Inpatient psychiatry cannot take the patient with a foley catheter in place.b  Constipation: Oral lactulose, enema and lactulose.  I have seen and examined this patient myself. I have spent 32 minutes in his evaluation and care.  DVT Prophylaxis:   SCDs  CODE STATUS: Full Code.  Family Communication: None available. Disposition: Inpatient psychiatry when a bed is avaialable. Nakeeta Sebastiani, DO [facility] Direct contact: see [email or web address]  7PM-7AM contact night coverage as above 03/06/2019, 5:11 PM  LOS: 1 day

## 2019-03-07 ENCOUNTER — Encounter (HOSPITAL_COMMUNITY): Payer: Self-pay | Admitting: Internal Medicine

## 2019-03-07 MED ORDER — DIPHENHYDRAMINE HCL 25 MG PO CAPS
50.0000 mg | ORAL_CAPSULE | Freq: Four times a day (QID) | ORAL | Status: DC | PRN
  Administered 2019-03-07 – 2019-03-15 (×28): 50 mg via ORAL
  Filled 2019-03-07 (×29): qty 2

## 2019-03-07 NOTE — Progress Notes (Signed)
PROGRESS NOTE  Adam Castro VFI:[id number] DOB: 07-Sep-1960 DOA: 02/28/2019 PCP: Patient, No Pcp Per  Brief History   Adam Castro  is a 59 y.o. male,  w ? schizophrenia / bipolar, hypertension, apparently presents from home after stabbing himself. Pt was seen by Trauma, and Urology.   The patient has been seen by psychiatry. He will be accepted to inpatient behavioral health after he is medically cleared. He apparently stabbed himself in the penis to rid himself of demons. Foley has been placed. Laceration has been closed with 3 sutures. There is no urological injury at this time per urology. No hydronephrosis is noted, although there is some bladder outlet obstruction due to BPH. He was cleared by trauma surgery. Nephrology has been consulted.  The patient's wound has been evaluated by wound care. He has had no fevers, his creatinine has normalized, and he has no leukocytosis. He is medically cleared to go to inpatient psych.  As per the recommendation of Dr. Suzanne Boron Diarmid the patient's foley catheter was removed and a voiding trial was performed on 03/04/2019. The patient was unable to urinate without foley catheter and it had to be replaced. Previous MD have discussed the patient with Dr. Lovena Neighbours. He recommended increasing Flomax to 0.8 daily this was done on 1/11 and try voiding trial again 1/14. As psych has no beds anyway.  The patient will be discharged to inpatient psychiatry when the patient has urinated without a foley catheter, and a bed is availalble.  Consultants  . Urology . Trauma surgery . Nephrology . Psychiatry  Procedures  . Suture of laceration to pelvis.  Antibiotics   Anti-infectives (From admission, onward)   Start     Dose/Rate Route Frequency Ordered Stop   03/03/19 1645  cephALEXin (KEFLEX) capsule 500 mg  Status:  Discontinued     500 mg Oral Every 8 hours 03/03/19 1633 03/06/19 1711   03/03/19 0000  cephALEXin (KEFLEX) 500 MG capsule  Status:  Discontinued      500 mg Oral 4 times daily 03/03/19 1015 03/03/19    02/28/19 2330  cefTRIAXone (ROCEPHIN) 1 g in sodium chloride 0.9 % 100 mL IVPB  Status:  Discontinued     1 g 200 mL/hr over 30 Minutes Intravenous Daily at bedtime 02/28/19 2254 03/03/19 1633   02/28/19 2045  cephALEXin (KEFLEX) capsule 500 mg  Status:  Discontinued     500 mg Oral Every 6 hours 02/28/19 2037 02/28/19 2253      Subjective  The patient is resting quietly,having breakfast this AM with sitter at the bedside. He has no new complaints.  Objective   Vitals:  Vitals:   03/07/19 0307 03/07/19 0758  BP: 111/66 (!) 112/91  Pulse: 97 94  Resp:  18  Temp: 98.2 F (36.8 C) 98.3 F (36.8 C)  SpO2:  98%   Exam:  Constitutional:  . The patient is awake, alert, and oriented x 3. No acute distress. Respiratory:  . No increased work of breathing. . No wheezes, rales, or rhonchi . No tactile fremitus Cardiovascular:  . Regular rate and rhythm . No murmurs, ectopy, or gallups. . No lateral PMI. No thrills. Abdomen:  . Abdomen is soft, non-tender, non-distended . No hernias, masses, or organomegaly . Normoactive bowel sounds.  Musculoskeletal:  . No cyanosis, clubbing, or edema Skin:  . No rashes, lesions, ulcers . palpation of skin: no induration or nodules . Sutures and wound have some bloody oozing. Otherwise, no purulence. Neurologic:  . Pt  is awake, alert, and oriented x 3. He is moving all extremities. Psychiatric:  . Normal affect. Mood congruent.  I have personally reviewed the following:   Today's Data  . Vitals, BMP, CBC  Scheduled Meds: . Chlorhexidine Gluconate Cloth  6 each Topical Daily  . lidocaine  1 application Urethral Once  . metoprolol tartrate  25 mg Oral BID  . polyethylene glycol  17 g Oral BID  . tamsulosin  0.8 mg Oral QPC supper   Continuous Infusions:   Principal Problem:   ARF (acute renal failure) (HCC) Active Problems:   Stab wound   Psychosis (HCC)   Acute lower UTI    Thrombocytopenia (HCC)   Hypoglycemia   LOS: 7 days   A & P  Laceration to Base of penis/pelvis: Wound closed by penis. Foley catheter placed as the patient was unable to pass urine. No true urological injury per urology. Appreciate urology input. He will need to have sutures removed between 03/08/2019 and 03/10/2019 per wound care. Wound care has left instructions for care of the wound.   Psychosis: Pt stabbed himself in an attempt to let the demons out. He is IVC'd and has been evaluated by psychiatry. He will go to inpatient psych once he is medically cleared. Safety restraints as necessary.  Acute renal failure: Improved. Creatinine is normalized.   Acute lower uti: Urine culture. Treatment completed.  Hypoglycemia: Improved. Monitor.   Bladder outlet obstruction likely due to BPH: Foley catheter removed, but patient was unable to urinate on his own. Foley catheter had to be replaced. Previous MD discussed the patient with Dr. Lovena Neighbours. He recommended increasing the dose of Flomax to 0.8 mg daily. Inpatient psychiatry cannot take the patient with a foley catheter in place. Will do voiding trial 1/14.  Constipation: Oral lactulose, enema and lactulose.  I have seen and examined this patient myself. I have spent 25 minutes in his evaluation and care.  DVT Prophylaxis:   SCDs  CODE STATUS: Full Code.  Family Communication: None available. Disposition: Inpatient psychiatry when a bed is avaialable.

## 2019-03-07 NOTE — Plan of Care (Signed)
  Problem: Education: Goal: Knowledge of General Education information will improve Description: Including pain rating scale, medication(s)/side effects and non-pharmacologic comfort measures Outcome: Progressing   Problem: Health Behavior/Discharge Planning: Goal: Ability to manage health-related needs will improve Outcome: Progressing   Problem: Clinical Measurements: Goal: Ability to maintain clinical measurements within normal limits will improve Outcome: Progressing Goal: Will remain free from infection Outcome: Progressing Goal: Diagnostic test results will improve Outcome: Progressing Goal: Respiratory complications will improve Outcome: Progressing Goal: Cardiovascular complication will be avoided Outcome: Progressing   Problem: Activity: Goal: Risk for activity intolerance will decrease Outcome: Progressing   Problem: Nutrition: Goal: Adequate nutrition will be maintained Outcome: Progressing   Problem: Coping: Goal: Level of anxiety will decrease Outcome: Progressing   Problem: Elimination: Goal: Will not experience complications related to bowel motility Outcome: Progressing Goal: Will not experience complications related to urinary retention Outcome: Not Progressing  Pt still requiring foley catheter for bladder outlet obstruction.  Problem: Pain Managment: Goal: General experience of comfort will improve Outcome: Not Progressing  Pt still requiring pain meds every 4-6 hours. Problem: Safety: Goal: Ability to remain free from injury will improve Outcome: Progressing   Problem: Skin Integrity: Goal: Risk for impaired skin integrity will decrease Outcome: Progressing

## 2019-03-08 MED ORDER — CAMPHOR-MENTHOL 0.5-0.5 % EX LOTN
TOPICAL_LOTION | CUTANEOUS | Status: DC | PRN
  Filled 2019-03-08 (×2): qty 222

## 2019-03-08 NOTE — Progress Notes (Signed)
Catheter removed at 0930, unable to void at present time. Bladder scan 589. Dr. Kirby Crigler notified. New orders for coude cath. [facility] charge nurse called. Will place catheter when avail. Emelda Brothers RN

## 2019-03-08 NOTE — Progress Notes (Signed)
Pt complaining of itching and irritation in areas that paper tape was applied. 50 mg of Benadryl was given at 0215 and ordered Q6. Advised pt that more Benadryl can not be given at this time. Suggested that patient apply cold compress to areas to see if that will relieve the itching. He requested more Benadryl. Sent text page to [facility] NP to advise.

## 2019-03-08 NOTE — Progress Notes (Signed)
PROGRESS NOTE  KOHLER PELLERITO YIR:[id number] DOB: 02-25-1960 DOA: 02/28/2019 PCP: Patient, No Pcp Per  Brief History   Fabyan Loughmiller  is a 59 y.o. male,  w ? schizophrenia / bipolar, hypertension, apparently presents from home after stabbing himself. Pt was seen by Trauma, and Urology.   The patient has been seen by psychiatry. He will be accepted to inpatient behavioral health after he is medically cleared. He apparently stabbed himself in the penis to rid himself of demons. Foley has been placed. Laceration has been closed with 3 sutures. There is no urological injury at this time per urology. No hydronephrosis is noted, although there is some bladder outlet obstruction due to BPH. He was cleared by trauma surgery. Nephrology has been consulted.  The patient's wound has been evaluated by wound care. He has had no fevers, his creatinine has normalized, and he has no leukocytosis. He is medically cleared to go to inpatient psych.  As per the recommendation of Dr. Suzanne Boron Diarmid the patient's foley catheter was removed and a voiding trial was performed on 03/04/2019. The patient was unable to urinate without foley catheter and it had to be replaced. Previous MD have discussed the patient with Dr. Lovena Neighbours. He recommended increasing Flomax to 0.8 daily this was done on 1/11 and try voiding trial again 1/14. As psych has no beds anyway.  The patient will be discharged to inpatient psychiatry when the patient has urinated without a foley catheter, and a bed is availalble.  Consultants  . Urology . Trauma surgery . Nephrology . Psychiatry  Procedures  . Suture of laceration to pelvis.  Antibiotics   Anti-infectives (From admission, onward)   Start     Dose/Rate Route Frequency Ordered Stop   03/03/19 1645  cephALEXin (KEFLEX) capsule 500 mg  Status:  Discontinued     500 mg Oral Every 8 hours 03/03/19 1633 03/06/19 1711   03/03/19 0000  cephALEXin (KEFLEX) 500 MG capsule  Status:  Discontinued      500 mg Oral 4 times daily 03/03/19 1015 03/03/19    02/28/19 2330  cefTRIAXone (ROCEPHIN) 1 g in sodium chloride 0.9 % 100 mL IVPB  Status:  Discontinued     1 g 200 mL/hr over 30 Minutes Intravenous Daily at bedtime 02/28/19 2254 03/03/19 1633   02/28/19 2045  cephALEXin (KEFLEX) capsule 500 mg  Status:  Discontinued     500 mg Oral Every 6 hours 02/28/19 2037 02/28/19 2253      Subjective  The patient is resting quietly, pleasant and cooperative. Had reaction to paper tape yesterday and asking for higher dose benadryl.  Objective   Vitals:  Vitals:   03/07/19 2357 03/08/19 0358  BP: 120/83 (!) 135/94  Pulse: 85 89  Resp: 16 18  Temp: 98.5 F (36.9 C) 97.6 F (36.4 C)  SpO2: 98% 99%   Exam:  Constitutional:  . The patient is awake, alert, and oriented x 3. No acute distress. Respiratory:  . No increased work of breathing. . No wheezes, rales, or rhonchi . No tactile fremitus Cardiovascular:  . Regular rate and rhythm . No murmurs, ectopy, or gallups. . No lateral PMI. No thrills. Abdomen:  . Abdomen is soft, non-tender, non-distended . No hernias, masses, or organomegaly . Normoactive bowel sounds.  Musculoskeletal:  . No cyanosis, clubbing, or edema Skin:  . Small area of rash on left inner arm and right inner thigh . palpation of skin: no induration or nodules . Sutures and wound have  some bloody oozing. Otherwise, no purulence. Neurologic:  . Pt is awake, alert, and oriented x 3. He is moving all extremities. Psychiatric:  . Normal affect. Mood congruent.  I have personally reviewed the following:   Today's Data  . Vitals, BMP, CBC  Scheduled Meds: . Chlorhexidine Gluconate Cloth  6 each Topical Daily  . lidocaine  1 application Urethral Once  . metoprolol tartrate  25 mg Oral BID  . polyethylene glycol  17 g Oral BID  . tamsulosin  0.8 mg Oral QPC supper   Continuous Infusions:   Principal Problem:   ARF (acute renal failure) (HCC) Active  Problems:   Stab wound   Psychosis (HCC)   Acute lower UTI   Thrombocytopenia (HCC)   Hypoglycemia   LOS: 8 days   A & P  Laceration to Base of penis/pelvis: Wound closed by urology. Foley catheter placed as the patient was unable to pass urine. No true urological injury per urology. Appreciate urology input. He will need to have sutures removed between 03/08/2019 and 03/10/2019 per wound care. Wound care has left instructions for care of the wound.   Psychosis: Pt stabbed himself in an attempt to let the demons out. He is IVC'd and has been evaluated by psychiatry. He will go to inpatient psych once he is medically cleared. Safety restraints as necessary.  Acute renal failure: Improved. Creatinine is normalized.   Acute lower uti: Urine culture. Treatment completed.  Hypoglycemia: Improved. Monitor.   Bladder outlet obstruction likely due to BPH: Foley catheter removed, but patient was unable to urinate on his own. Foley catheter had to be replaced. Previous MD discussed the patient with Dr. Lovena Neighbours. He recommended increasing the dose of Flomax to 0.8 mg daily. Inpatient psychiatry cannot take the patient with a foley catheter in place. Will do voiding trial today 1/14, asked RN to remove Foley today.  Constipation: Oral lactulose, enema and lactulose.  I have seen and examined this patient myself. I have spent 25 minutes in his evaluation and care.  DVT Prophylaxis:   SCDs  CODE STATUS: Full Code.  Family Communication: None available. Disposition: Inpatient psychiatry when a bed is avaialable and patient is urinating on his own.

## 2019-03-08 NOTE — TOC Progression Note (Addendum)
Transition of Care [facility]) - Progression Note    Patient Details  Name: Adam Castro MRN: [id number] Date of Birth: 1960-08-02  Transition of Care [facility]) CM/SW Contact  Mearl Latin, LCSW Phone Number: 03/08/2019, 10:22 AM  Clinical Narrative:    CSW sent referral for review at [facility]. Barrier is foley catheter.   RN will monitor retention now that foley is removed. CSW asking [facility] to review again. Awaiting MD to sign updated IVC.         Expected Discharge Plan and Services           Expected Discharge Date: 03/03/19                                     Social Determinants of Health (SDOH) Interventions    Readmission Risk Interventions No flowsheet data found.

## 2019-03-09 NOTE — TOC Progression Note (Addendum)
Transition of Care [facility]) - Progression Note    Patient Details  Name: Adam Castro MRN: [id number] Date of Birth: 07-06-1960  Transition of Care [facility]) CM/SW Contact  Mearl Latin, LCSW Phone Number: 03/09/2019, 9:44 AM  Clinical Narrative:    CSW notes catheter being replaced. Will contact Geropsych facilities like [facility] to see if they can accept patient. Requesting updated psych note and labs.   IVC renewed and on hard chart. [employer] to serve patient.         Expected Discharge Plan and Services           Expected Discharge Date: 03/03/19                                     Social Determinants of Health (SDOH) Interventions    Readmission Risk Interventions No flowsheet data found.

## 2019-03-09 NOTE — Progress Notes (Signed)
PROGRESS NOTE    Adam Castro  ZOX:[id number] DOB: 1960-09-18 DOA: 02/28/2019 PCP: Patient, No Pcp Per    Brief Narrative:  SamuelDavisis a58 y.o.male,w? schizophrenia / bipolar, hypertension, apparently presents from home after stabbing himself. Pt was seen by Trauma, and Urology.   The patient has been seen by psychiatry. He will be accepted to inpatient behavioral health after he is medically cleared. He apparently stabbed himself in the penis to rid himself of demons. Foley has been placed. Laceration has been closed with 3 sutures. There is no urological injury at this time per urology. No hydronephrosis is noted, although there is some bladder outlet obstruction due to BPH. He was cleared by trauma surgery. Nephrology has been consulted.  The patient's wound has been evaluated by wound care. He has had no fevers, his creatinine has normalized, and he has no leukocytosis. He is medically cleared to go to inpatient psych.  As per the recommendation of Dr. Suzanne Boron Castro the patient's foley catheter was removed and a voiding trial was performed on 03/04/2019. The patient was unable to urinate without foley catheter and it had to be replaced. Previous MD have discussed the patient with Dr. Lovena Castro. He recommended increasing Flomax to 0.8 daily this was done on 1/11 and try voiding trial again 1/14. As psych has no beds anyway.  The patient will be discharged to inpatient psychiatry when the patient has urinated without a foley catheter, and a bed is availalble.   Assessment & Plan:   Principal Problem:   ARF (acute renal failure) (HCC) Active Problems:   Stab wound   Psychosis (HCC)   Acute lower UTI   Thrombocytopenia (HCC)   Hypoglycemia  Laceration to Base of penis/pelvis: Wound closed by urology. Foley catheter placed as the patient was unable to pass urine. No true urological injury per urology. Appreciate urology input. He will need to have sutures removed between  03/08/2019 and 03/10/2019 per wound care. Wound care has left instructions for care of the wound.   Psychosis: Pt stabbed himself in an attempt to let the demons out. He is IVC'd and has been evaluated by psychiatry. He will go to inpatient psych once he is medically cleared. Safety restraints as necessary. IVC papers re-signed today. Re-consulted psych, to aide in placement.  Acute renal failure: Improved. Creatinine is normalized.   Acute lower uti: Urine culture. Treatment completed.  Hypoglycemia: Improved. Monitor.   Bladder outlet obstruction likely due to BPH: Foley catheter removed, but patient was unable to urinate on his own. Foley catheter had to be replaced. Previous MD discussed the patient with Dr. Lovena Castro. He recommended increasing the dose of Flomax to 0.8 mg daily. Inpatient psychiatry cannot take the patient with a foley catheter in place. Will do voiding trial today 1/14, asked RN to remove Foley today.--failed again, re-attempt in 2 days. He thinks now that bowels are moving, this should improve.  Constipation: Oral lactulose, enema and lactulose.  DVT prophylaxis: SCD/Compression stockings Code Status: Full code  Family Communication: Paitent Disposition Plan: Inpaitent psych when bed available and able to void    Consultants:   Urology  Trauma surgery  Nephrology  Psychiatry  Procedures:  Suture laceration of abdomen  Antimicrobials: Anti-infectives (From admission, onward)   Start     Dose/Rate Route Frequency Ordered Stop   03/03/19 1645  cephALEXin (KEFLEX) capsule 500 mg  Status:  Discontinued     500 mg Oral Every 8 hours 03/03/19 1633 03/06/19 1711   03/03/19  0000  cephALEXin (KEFLEX) 500 MG capsule  Status:  Discontinued     500 mg Oral 4 times daily 03/03/19 1015 03/03/19    02/28/19 2330  cefTRIAXone (ROCEPHIN) 1 g in sodium chloride 0.9 % 100 mL IVPB  Status:  Discontinued     1 g 200 mL/hr over 30 Minutes Intravenous Daily at bedtime  02/28/19 2254 03/03/19 1633   02/28/19 2045  cephALEXin (KEFLEX) capsule 500 mg  Status:  Discontinued     500 mg Oral Every 6 hours 02/28/19 2037 02/28/19 2253       Subjective: Feels well today, foley back in place  Objective: Vitals:   03/08/19 0922 03/08/19 1829 03/08/19 2020 03/09/19 0410  BP: (!) 132/101 (!) 138/103 (!) 158/110 122/81  Pulse: (!) 113 93 (!) 102 92  Resp:  _0 Temp: 98.2 F (36.8 C) 98.6 F (37 C) 98.1 F (36.7 C) 99.3 F (37.4 C)  TempSrc: Oral Oral Oral Oral  SpO2: 100%  100% 100%  Weight:    82.9 kg  Height:        Intake/Output Summary (Last 24 hours) at 03/09/2019 1112 Last data filed at 03/09/2019 0918 Gross per 24 hour  Intake 600 ml  Output 3350 ml  Net -2750 ml   Filed Weights   03/07/19 0307 03/08/19 0500 03/09/19 0410  Weight: 81.1 kg 82.1 kg 82.9 kg    Examination:  General exam: Appears calm and comfortable  Respiratory system: Clear to auscultation. Respiratory effort normal. Cardiovascular system: S1 & S2 heard, RRR.  Gastrointestinal system: Abdomen is nondistended, soft and nontender.  Central nervous system: Alert and oriented. No focal neurological deficits. Extremities: Symmetric  Skin: No rashes Psychiatry: .Affect appropriate.     Data Reviewed: I have personally reviewed following labs and imaging studies  CBC: Recent Labs  Lab 03/03/19 0212 03/06/19 0515  WBC 3.6* 3.7*  NEUTROABS 1.5* 1.7  HGB 13.2 14.5  HCT 40.3 46.4  MCV 83.8 86.7  PLT 154 024   Basic Metabolic Panel: Recent Labs  Lab 03/03/19 0212 03/06/19 0515  NA 137 138  K 3.6 4.1  CL 100 103  CO2 26 24  GLUCOSE 104* 130*  BUN 19 10  CREATININE 0.81 0.78  CALCIUM 8.1* 8.9    Recent Results (from the past 240 hour(s))  Respiratory Panel by RT PCR (Flu A&B, Covid) - Urine, Catheterized     Status: None   Collection Time: 02/28/19  5:05 PM   Specimen: Urine, Catheterized  Result Value Ref Range Status   SARS Coronavirus 2 by RT  PCR NEGATIVE NEGATIVE Final    Comment: (NOTE) SARS-CoV-2 target nucleic acids are NOT DETECTED. The SARS-CoV-2 RNA is generally detectable in upper respiratoy specimens during the acute phase of infection. The lowest concentration of SARS-CoV-2 viral copies this assay can detect is 131 copies/mL. A negative result does not preclude SARS-Cov-2 infection and should not be used as the sole basis for treatment or other patient management decisions. A negative result may occur with  improper specimen collection/handling, submission of specimen other than nasopharyngeal swab, presence of viral mutation(s) within the areas targeted by this assay, and inadequate number of viral copies (<131 copies/mL). A negative result must be combined with clinical observations, patient history, and epidemiological information. The expected result is Negative. Fact Sheet for Patients:  PinkCheek.be Fact Sheet for Healthcare Providers:  GravelBags.it This test is not yet ap proved or cleared by the [place] FDA and  has  been authorized for detection and/or diagnosis of SARS-CoV-2 by FDA under an Emergency Use Authorization (EUA). This EUA will remain  in effect (meaning this test can be used) for the duration of the COVID-19 declaration under Section 564(b)(1) of the Act, 21 U.S.C. section 360bbb-3(b)(1), unless the authorization is terminated or revoked sooner.    Influenza A by PCR NEGATIVE NEGATIVE Final   Influenza B by PCR NEGATIVE NEGATIVE Final    Comment: (NOTE) The Xpert Xpress SARS-CoV-2/FLU/RSV assay is intended as an aid in  the diagnosis of influenza from Nasopharyngeal swab specimens and  should not be used as a sole basis for treatment. Nasal washings and  aspirates are unacceptable for Xpert Xpress SARS-CoV-2/FLU/RSV  testing. Fact Sheet for Patients: PinkCheek.be Fact Sheet for Healthcare  Providers: GravelBags.it This test is not yet approved or cleared by the [place] FDA and  has been authorized for detection and/or diagnosis of SARS-CoV-2 by  FDA under an Emergency Use Authorization (EUA). This EUA will remain  in effect (meaning this test can be used) for the duration of the  Covid-19 declaration under Section 564(b)(1) of the Act, 21  U.S.C. section 360bbb-3(b)(1), unless the authorization is  terminated or revoked. Performed at [facility], [place] [address] [place], [place] [address]       Radiology Studies: No results found.   Scheduled Meds: . Chlorhexidine Gluconate Cloth  6 each Topical Daily  . lidocaine  1 application Urethral Once  . metoprolol tartrate  25 mg Oral BID  . polyethylene glycol  17 g Oral BID  . tamsulosin  0.8 mg Oral QPC supper   Continuous Infusions:   LOS: 9 days    Donnamae Jude, MD 03/09/2019 11:12 AM [phone] [facility] If 7PM-7AM, please contact night-coverage 03/09/2019, 11:12 AM

## 2019-03-10 ENCOUNTER — Inpatient Hospital Stay (HOSPITAL_COMMUNITY): Payer: Self-pay

## 2019-03-10 DIAGNOSIS — F203 Undifferentiated schizophrenia: Secondary | ICD-10-CM

## 2019-03-10 NOTE — Progress Notes (Addendum)
PROGRESS NOTE  Adam Castro PQD:[id number] DOB: 06-17-1960 DOA: 02/28/2019 PCP: Patient, No Pcp Per  HPI/Recap of past 24 hours: *This is a 59 year old male with schizophrenia/bipolar, hypertension, who stabbed himself at home in the penis to rate himself of demons Foley has was placed and the laceration was closed with 3 sutures and was seen by urology who determined there was no urological injury ,no hydronephrosis was noted , although there was some bladder neck obstruction due to BPH also nephrology was consulted He was seen by psych who recommended inpatient psych when there is a bed but that there is no bed currently  Subjective: Patient seen and examined at bedside sitter is in the room.  He is complaining of pain not in the incision area in his suprapubic area where he stabbed himself.  Also complained to the nurse about abdominal pain.  He denies constipation.  Abdominal x-ray was are ordered and did not show any acute abdominal pathology abnormality   Assessment/Plan: Principal Problem:   ARF (acute renal failure) (HCC) Active Problems:   Stab wound   Psychosis (HCC)   Acute lower UTI   Thrombocytopenia (HCC)   Hypoglycemia  1.  Psychosis: Patient started without an attempt to let the months out his IVC and sitter is in the bedside he has been evaluated by psych pain with transfer to psych unit hospital when he is medically cleared  2.  Acute renal failure has resolved  3.  Acute lower urinary tract infection has resolved he was on Rocephin and then converted to Keflex which he has completed  4.  Stabbing wound wound is healing is a small knot around the left and of the wound and there is one area in the same location that is not quite opposed but is not gaping  Code Status: Full  Severity of Illness: The appropriate patient status for this patient is INPATIENT. Inpatient status is judged to be reasonable and necessary in order to provide the required intensity of  service to ensure the patient's safety. The patient's presenting symptoms, physical exam findings, and initial radiographic and laboratory data in the context of their chronic comorbidities is felt to place them at high risk for further clinical deterioration. Furthermore, it is not anticipated that the patient will be medically stable for discharge from the hospital within 2 midnights of admission. The following factors support the patient status of inpatient.   " The patient's presenting symptoms include self inflicted wound with laceration due to psychosis. " The worrisome physical exam findings include stabbing wound that is healing. " The initial radiographic and laboratory data are worrisome because of suprapubic laceration. " The chronic co-morbidities include schizophrenia.   * I certify that at the point of admission it is my clinical judgment that the patient will require inpatient hospital care spanning beyond 2 midnights from the point of admission due to high intensity of service, high risk for further deterioration and high frequency of surveillance required.*    Family Communication: Patient  Disposition Plan: Discharge to psych when he can void without catheter and when there is a bed available   Consultants:  Psychiatry  Urology  Trauma surgery  Procedures:  Suture  Antimicrobials:  Completed antibiotics  DVT prophylaxis: SCD   Objective: Vitals:   03/10/19 0825 03/10/19 1002 03/10/19 1052 03/10/19 1716  BP: (!) 118/93 121/82 131/89 135/90  Pulse: 91  92 81  Resp: 18     Temp: 98 F (36.7 C)  98.4 F (36.9 C)  TempSrc: Oral   Oral  SpO2: 100%   100%  Weight:      Height:        Intake/Output Summary (Last 24 hours) at 03/10/2019 2010 Last data filed at 03/10/2019 1909 Gross per 24 hour  Intake 1320 ml  Output 6300 ml  Net -4980 ml   Filed Weights   03/08/19 0500 03/09/19 0410 03/10/19 0436  Weight: 82.1 kg 82.9 kg 84.4 kg   Body mass index  is 28.29 kg/m.  Exam:  . General: 59 y.o. year-old male well developed well nourished in no acute distress.  Alert and oriented x3. . Cardiovascular: Regular rate and rhythm with no rubs or gallops.  No thyromegaly or JVD noted.   Marland Kitchen Respiratory: Clear to auscultation with no wheezes or rales. Good inspiratory effort. . Abdomen: Soft nontender nondistended with normal bowel sounds x4 quadrants. . Musculoskeletal: No lower extremity edema. 2/4 pulses in all 4 extremities. . Skin: No ulcerative lesions noted or rashes, . Psychiatry: Mood is appropriate for condition and setting    Data Reviewed: CBC: Recent Labs  Lab 03/06/19 0515  WBC 3.7*  NEUTROABS 1.7  HGB 14.5  HCT 46.4  MCV 86.7  PLT 270   Basic Metabolic Panel: Recent Labs  Lab 03/06/19 0515  NA 138  K 4.1  CL 103  CO2 24  GLUCOSE 130*  BUN 10  CREATININE 0.78  CALCIUM 8.9   GFR: Estimated Creatinine Clearance: 105.2 mL/min (by C-G formula based on SCr of 0.78 mg/dL). Liver Function Tests: No results for input(s): AST, ALT, ALKPHOS, BILITOT, PROT, ALBUMIN in the last 168 hours. No results for input(s): LIPASE, AMYLASE in the last 168 hours. No results for input(s): AMMONIA in the last 168 hours. Coagulation Profile: No results for input(s): INR, PROTIME in the last 168 hours. Cardiac Enzymes: No results for input(s): CKTOTAL, CKMB, CKMBINDEX, TROPONINI in the last 168 hours. BNP (last 3 results) No results for input(s): PROBNP in the last 8760 hours. HbA1C: No results for input(s): HGBA1C in the last 72 hours. CBG: No results for input(s): GLUCAP in the last 168 hours. Lipid Profile: No results for input(s): CHOL, HDL, LDLCALC, TRIG, CHOLHDL, LDLDIRECT in the last 72 hours. Thyroid Function Tests: No results for input(s): TSH, T4TOTAL, FREET4, T3FREE, THYROIDAB in the last 72 hours. Anemia Panel: No results for input(s): VITAMINB12, FOLATE, FERRITIN, TIBC, IRON, RETICCTPCT in the last 72 hours. Urine  analysis:    Component Value Date/Time   COLORURINE YELLOW 02/28/2019 1610   APPEARANCEUR HAZY (A) 02/28/2019 1610   LABSPEC 1.013 02/28/2019 1610   PHURINE 5.0 02/28/2019 1610   GLUCOSEU NEGATIVE 02/28/2019 1610   HGBUR SMALL (A) 02/28/2019 1610   BILIRUBINUR NEGATIVE 02/28/2019 1610   KETONESUR NEGATIVE 02/28/2019 1610   PROTEINUR NEGATIVE 02/28/2019 1610   NITRITE NEGATIVE 02/28/2019 1610   LEUKOCYTESUR NEGATIVE 02/28/2019 1610   Sepsis Labs: @LABRCNTIP (procalcitonin:4,lacticidven:4)  )No results found for this or any previous visit (from the past 240 hour(s)).    Studies: DG Abd Portable 1V  Result Date: 03/10/2019 CLINICAL DATA:  Penetrating trauma. EXAM: PORTABLE ABDOMEN - 1 VIEW COMPARISON:  February 28, 2019. FINDINGS: The bowel gas pattern is normal. No radio-opaque calculi or other significant radiographic abnormality are seen. IMPRESSION: Negative. Electronically Signed   By: Marijo Conception M.D.   On: 03/10/2019 14:41    Scheduled Meds: . Chlorhexidine Gluconate Cloth  6 each Topical Daily  . lidocaine  1 application Urethral  Once  . metoprolol tartrate  25 mg Oral BID  . polyethylene glycol  17 g Oral BID  . tamsulosin  0.8 mg Oral QPC supper    Continuous Infusions:   LOS: 10 days     Myrtie Neither, MD [facility]  To reach me or the doctor on call, go to: [email or web address] Password [email or web address]  03/10/2019, 8:10 PM

## 2019-03-10 NOTE — TOC Progression Note (Addendum)
Transition of Care [facility]) - Progression Note    Patient Details  Name: Adam Castro MRN: [id number] Date of Birth: 1960/09/28  Transition [facility]) CM/SW Contact  Mearl Latin, LCSW Phone Number: 03/10/2019, 11:40 AM  Clinical Narrative:    CSW heard back from [place]. They report self-pay rate as minimum of $500/day. [facility] does not have beds for those without insurance. [employer], [facility], [facility], and [facility] unable to accommodate foley. CSW to continue search.         Expected Discharge Plan and Services           Expected Discharge Date: 03/03/19                                     Social Determinants of Health (SDOH) Interventions    Readmission Risk Interventions No flowsheet data found.

## 2019-03-11 ENCOUNTER — Inpatient Hospital Stay (HOSPITAL_COMMUNITY): Payer: Self-pay

## 2019-03-11 DIAGNOSIS — I724 Aneurysm of artery of lower extremity: Secondary | ICD-10-CM

## 2019-03-11 NOTE — Progress Notes (Signed)
VASCULAR LAB PRELIMINARY  PRELIMINARY  PRELIMINARY  PRELIMINARY  Left groin ultrasound completed.    Preliminary report:  See CV proc for preliminary results.   Jolette Lana, RVT 03/11/2019, 4:56 PM
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
## 2019-03-12 MED ORDER — HYDROCORTISONE 1 % EX CREA
1.0000 "application " | TOPICAL_CREAM | Freq: Three times a day (TID) | CUTANEOUS | Status: DC | PRN
  Administered 2019-03-12 – 2019-03-18 (×4): 1 via TOPICAL
  Filled 2019-03-12 (×5): qty 28
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
## 2019-03-13 LAB — CBC
HCT: 42.6 % (ref 39.0–52.0)
Hemoglobin: 13 g/dL (ref 13.0–17.0)
MCH: 26.7 pg (ref 26.0–34.0)
MCHC: 30.5 g/dL (ref 30.0–36.0)
MCV: 87.7 fL (ref 80.0–100.0)
Platelets: 282 10*3/uL (ref 150–400)
RBC: 4.86 MIL/uL (ref 4.22–5.81)
RDW: 13.3 % (ref 11.5–15.5)
WBC: 4.2 10*3/uL (ref 4.0–10.5)
nRBC: 0 % (ref 0.0–0.2)

## 2019-03-13 LAB — BASIC METABOLIC PANEL
Anion gap: 10 (ref 5–15)
BUN: 12 mg/dL (ref 6–20)
CO2: 30 mmol/L (ref 22–32)
Calcium: 9.1 mg/dL (ref 8.9–10.3)
Chloride: 98 mmol/L (ref 98–111)
Creatinine, Ser: 0.68 mg/dL (ref 0.61–1.24)
GFR calc Af Amer: >60 mL/min (ref >60)
GFR calc non Af Amer: >60 mL/min (ref >60)
Glucose, Bld: 118 mg/dL — ABNORMAL HIGH (ref 70–99)
Potassium: 4 mmol/L (ref 3.5–5.1)
Sodium: 138 mmol/L (ref 135–145)
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
## 2019-03-14 MED ORDER — LIDOCAINE HCL URETHRAL/MUCOSAL 2 % EX GEL
1.0000 "application " | Freq: Once | CUTANEOUS | Status: DC
  Filled 2019-03-14: qty 20

## 2019-03-14 NOTE — TOC Progression Note (Addendum)
Transition of Care [facility]) - Progression Note    Patient Details  Name: INGVALD THEISEN MRN: [id number] Date of Birth: October 08, 1960  [facility] CM/SW Contact  Levada Schilling Phone Number: 03/14/2019, 3:14 PM  Clinical Narrative:    CSW fax information to [facility].  Fax number is [phone], phone number [phone].Patient will need a chest xray for [facility]. It is part of their admission process.       [facility]  Does not have geriatric beds for patient.can not take patient.  [facility] is reviewing pt. Expected Discharge Plan and Services           Expected Discharge Date: 03/03/19                                     Social Determinants of Health (SDOH) Interventions    Readmission Risk Interventions No flowsheet data found.

## 2019-03-14 NOTE — Progress Notes (Signed)
Pt had voided x 2 after foley removed this am. Pt began to c/o increasing lower abd pain and unable to void. Bladder scan . Notified Dr. Jonathon Bellows. New orders given. Also asked if psych could be reconsulted to see if pt still needed inpt psych. Cont to monitor. Emelda Brothers RN
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
## 2019-03-15 DIAGNOSIS — R339 Retention of urine, unspecified: Secondary | ICD-10-CM

## 2019-03-15 MED ORDER — HYDROXYZINE HCL 10 MG PO TABS
10.0000 mg | ORAL_TABLET | Freq: Three times a day (TID) | ORAL | Status: DC | PRN
  Administered 2019-03-15 – 2019-03-22 (×15): 10 mg via ORAL
  Filled 2019-03-15 (×16): qty 1

## 2019-03-15 MED ORDER — BETHANECHOL CHLORIDE 5 MG PO TABS
5.0000 mg | ORAL_TABLET | Freq: Three times a day (TID) | ORAL | Status: DC
  Administered 2019-03-15 – 2019-03-22 (×23): 5 mg via ORAL
  Filled 2019-03-15 (×23): qty 1
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
## 2019-03-15 NOTE — Plan of Care (Signed)
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
## 2019-03-16 ENCOUNTER — Other Ambulatory Visit: Payer: Self-pay

## 2019-03-16 MED ORDER — QUETIAPINE FUMARATE ER 50 MG PO TB24
100.0000 mg | ORAL_TABLET | Freq: Every day | ORAL | Status: DC
  Filled 2019-03-16 (×2): qty 2

## 2019-03-16 MED ORDER — FINASTERIDE 5 MG PO TABS
5.0000 mg | ORAL_TABLET | Freq: Every day | ORAL | Status: DC
  Administered 2019-03-16 – 2019-03-22 (×7): 5 mg via ORAL
  Filled 2019-03-16 (×7): qty 1

## 2019-03-16 MED ORDER — FINASTERIDE 5 MG PO TABS: 5.0000 mg | ORAL_TABLET | Freq: Every day | ORAL | 0 refills | Status: DC

## 2019-03-16 NOTE — Progress Notes (Signed)
Patient refused CHG bath today. He stated it is causing him itching of the skin

## 2019-03-16 NOTE — Plan of Care (Signed)

## 2019-03-16 NOTE — Progress Notes (Signed)
Patient meets inpatient criteria per Berneice Heinrich, NP. Patient has been faxed out to the following facilities for review:   [facility]  [facility]   [facility]    [facility]    [facility]    [facility]  [facility]   [facility]    [facility]   [facility]    [facility]  [facility]  CSW will continue to follow and assist with disposition planning.   Drucilla Schmidt, MSW, LCSW-A Clinical Disposition Social Worker [facility]/TTS [phone]

## 2019-03-16 NOTE — Progress Notes (Signed)
[facility] called, they don't have any beds available now. They are possibly interested. The patient would have to have his foley taken out before he went. They will follow up over the weekend.   CSW will continue to follow and assist with disposition planning.   Drucilla Schmidt, MSW, LCSW-A Clinical Disposition Social Worker [facility]/TTS [phone]

## 2019-03-16 NOTE — Progress Notes (Signed)
CSW is following from [facility] Disposition. Patient is not medically ready. The patient still has a foley catheter and is having issues voiding. The patient was also started on a new medication. The team will re-access over the weekend about discharge readiness.   CSW will continue to follow and assist with disposition planning.   Drucilla Schmidt, MSW, LCSW-A Clinical Disposition Social Worker [facility]/TTS [phone]

## 2019-03-16 NOTE — Progress Notes (Signed)
Pt refusing seroquel despite multiple attempts of explaining the necessity of medication.
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
## 2019-03-16 NOTE — Plan of Care (Signed)

## 2019-03-17 MED ORDER — QUETIAPINE FUMARATE ER 50 MG PO TB24
50.0000 mg | ORAL_TABLET | Freq: Every day | ORAL | Status: DC
  Administered 2019-03-19: 50 mg via ORAL
  Filled 2019-03-17 (×6): qty 1

## 2019-03-17 MED ORDER — QUETIAPINE FUMARATE ER 50 MG PO TB24: 50.0000 mg | ORAL_TABLET | Freq: Every day | ORAL | 1 refills | Status: DC
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
## 2019-03-17 NOTE — Plan of Care (Signed)
  Problem: Education: Goal: Knowledge of General Education information will improve Description: Including pain rating scale, medication(s)/side effects and non-pharmacologic comfort measures Outcome: Progressing   Problem: Health Behavior/Discharge Planning: Goal: Ability to manage health-related needs will improve Outcome: Progressing   Problem: Clinical Measurements: Goal: Ability to maintain clinical measurements within normal limits will improve Outcome: Progressing   Problem: Skin Integrity: Goal: Risk for impaired skin integrity will decrease Outcome: Progressing   
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
## 2019-03-17 NOTE — Progress Notes (Signed)
CSW spoke with MD Alvester Morin and RN about expired IVC and if it needed to be renewed. CSW was informed that patient is amenable to inpatient placement and is in agreement to going. MD Alvester Morin  is in agreement with doing a voluntary commitment. Patient signed voluntary commitment papers which is in patient's chart.
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
## 2019-03-19 NOTE — Progress Notes (Signed)
Since removal of Foley catheter, patient has only voided small amounts and has been unable to empty his bladder fully. Bladder scan volume 467 and patient is feeling symptomatic. Obtained order to replace coude catheter; called charge RN on 6N who will come to unit to place.

## 2019-03-19 NOTE — Progress Notes (Signed)
Called to room by patient. Patient's Foley catheter had developed a hole in the tubing. Foley catheter had to be removed for this reason. Will notify MD.

## 2019-03-20 DIAGNOSIS — N320 Bladder-neck obstruction: Secondary | ICD-10-CM

## 2019-03-20 NOTE — Progress Notes (Signed)
[facility] is unable to accept pt while foley catheter is in place.   Wells Guiles, LCSW, LCAS Disposition CSW [facility] [phone]
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
## 2019-03-21 DIAGNOSIS — N320 Bladder-neck obstruction: Secondary | ICD-10-CM | POA: Diagnosis present
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
## 2019-03-21 NOTE — Progress Notes (Signed)
Bladder scan showed 467cc. Walked patient through how to do peri care and self cath. He successfully cathed himself, 400 cc output

## 2019-03-21 NOTE — Progress Notes (Signed)
Post void bladder scan showed 497 ml. Performed sterile straight catheterization with Shanda Bumps, RN. Educated patient about what he would do at home when he clean intermittent caths himself. Patient agreed to try to clean cath himself next time it is due.

## 2019-03-22 ENCOUNTER — Inpatient Hospital Stay (HOSPITAL_COMMUNITY)
Admission: AD | Admit: 2019-03-22 | Discharge: 2019-03-24 | DRG: 897 | Disposition: A | Discharge status: Home / Self Care | Payer: Federal, State, Local not specified - Other | Source: Intra-hospital | Attending: Psychiatry | Admitting: Psychiatry

## 2019-03-22 ENCOUNTER — Encounter (HOSPITAL_COMMUNITY): Payer: Self-pay | Admitting: Psychiatry

## 2019-03-22 ENCOUNTER — Other Ambulatory Visit: Payer: Self-pay

## 2019-03-22 ENCOUNTER — Encounter (HOSPITAL_COMMUNITY): Payer: Self-pay | Admitting: Internal Medicine

## 2019-03-22 DIAGNOSIS — F200 Paranoid schizophrenia: Secondary | ICD-10-CM | POA: Diagnosis not present

## 2019-03-22 DIAGNOSIS — N400 Enlarged prostate without lower urinary tract symptoms: Secondary | ICD-10-CM | POA: Diagnosis present

## 2019-03-22 DIAGNOSIS — Z91048 Other nonmedicinal substance allergy status: Secondary | ICD-10-CM | POA: Diagnosis not present

## 2019-03-22 DIAGNOSIS — F15150 Other stimulant abuse with stimulant-induced psychotic disorder with delusions: Principal | ICD-10-CM | POA: Diagnosis present

## 2019-03-22 DIAGNOSIS — N390 Urinary tract infection, site not specified: Secondary | ICD-10-CM | POA: Diagnosis present

## 2019-03-22 DIAGNOSIS — F209 Schizophrenia, unspecified: Secondary | ICD-10-CM | POA: Diagnosis present

## 2019-03-22 DIAGNOSIS — Z87891 Personal history of nicotine dependence: Secondary | ICD-10-CM | POA: Diagnosis not present

## 2019-03-22 DIAGNOSIS — B356 Tinea cruris: Secondary | ICD-10-CM | POA: Diagnosis present

## 2019-03-22 DIAGNOSIS — X789XXA Intentional self-harm by unspecified sharp object, initial encounter: Secondary | ICD-10-CM | POA: Diagnosis present

## 2019-03-22 DIAGNOSIS — Z23 Encounter for immunization: Secondary | ICD-10-CM | POA: Diagnosis not present

## 2019-03-22 DIAGNOSIS — I10 Essential (primary) hypertension: Secondary | ICD-10-CM | POA: Diagnosis present

## 2019-03-22 DIAGNOSIS — Z7289 Other problems related to lifestyle: Secondary | ICD-10-CM | POA: Diagnosis not present

## 2019-03-22 DIAGNOSIS — L989 Disorder of the skin and subcutaneous tissue, unspecified: Secondary | ICD-10-CM | POA: Diagnosis present

## 2019-03-22 DIAGNOSIS — Y900 Blood alcohol level of less than 20 mg/100 ml: Secondary | ICD-10-CM | POA: Diagnosis present

## 2019-03-22 DIAGNOSIS — G4700 Insomnia, unspecified: Secondary | ICD-10-CM | POA: Diagnosis present

## 2019-03-22 DIAGNOSIS — F15950 Other stimulant use, unspecified with stimulant-induced psychotic disorder with delusions: Secondary | ICD-10-CM

## 2019-03-22 MED ORDER — METOPROLOL TARTRATE 25 MG PO TABS
25.0000 mg | ORAL_TABLET | Freq: Two times a day (BID) | ORAL | Status: DC
  Administered 2019-03-23: 25 mg via ORAL
  Filled 2019-03-22 (×3): qty 1

## 2019-03-22 MED ORDER — CLOTRIMAZOLE 1 % EX CREA
TOPICAL_CREAM | Freq: Two times a day (BID) | CUTANEOUS | Status: DC
  Filled 2019-03-22: qty 15

## 2019-03-22 MED ORDER — HYDROCORTISONE 1 % EX CREA: 1.0000 "application " | TOPICAL_CREAM | Freq: Three times a day (TID) | CUTANEOUS | Status: DC | PRN

## 2019-03-22 MED ORDER — QUETIAPINE FUMARATE ER 50 MG PO TB24
50.0000 mg | ORAL_TABLET | Freq: Every day | ORAL | Status: DC
  Administered 2019-03-23: 50 mg via ORAL
  Filled 2019-03-22 (×3): qty 1

## 2019-03-22 MED ORDER — ACETAMINOPHEN 650 MG RE SUPP
650.0000 mg | Freq: Four times a day (QID) | RECTAL | Status: DC | PRN
  Filled 2019-03-22: qty 1

## 2019-03-22 MED ORDER — CLOTRIMAZOLE 1 % EX CREA
TOPICAL_CREAM | Freq: Two times a day (BID) | CUTANEOUS | Status: DC
  Administered 2019-03-22: 1 via TOPICAL
  Filled 2019-03-22: qty 15

## 2019-03-22 MED ORDER — ACETAMINOPHEN 325 MG PO TABS: 650.0000 mg | ORAL_TABLET | Freq: Four times a day (QID) | ORAL | Status: DC | PRN

## 2019-03-22 MED ORDER — SENNOSIDES-DOCUSATE SODIUM 8.6-50 MG PO TABS: 1.0000 | ORAL_TABLET | Freq: Every evening | ORAL | Status: DC | PRN

## 2019-03-22 MED ORDER — FINASTERIDE 5 MG PO TABS
5.0000 mg | ORAL_TABLET | Freq: Every day | ORAL | Status: DC
  Administered 2019-03-23 – 2019-03-24 (×2): 5 mg via ORAL
  Filled 2019-03-22 (×4): qty 1

## 2019-03-22 MED ORDER — BETHANECHOL CHLORIDE 5 MG PO TABS
5.0000 mg | ORAL_TABLET | Freq: Three times a day (TID) | ORAL | Status: DC
  Administered 2019-03-23 – 2019-03-24 (×5): 5 mg via ORAL
  Filled 2019-03-22 (×10): qty 1

## 2019-03-22 MED ORDER — OXYCODONE HCL 5 MG PO TABS
10.0000 mg | ORAL_TABLET | ORAL | Status: DC | PRN
  Administered 2019-03-22 – 2019-03-24 (×8): 10 mg via ORAL
  Filled 2019-03-22 (×9): qty 2

## 2019-03-22 MED ORDER — CAMPHOR-MENTHOL 0.5-0.5 % EX LOTN
TOPICAL_LOTION | CUTANEOUS | Status: DC | PRN
  Filled 2019-03-22: qty 222

## 2019-03-22 MED ORDER — CLOTRIMAZOLE 1 % EX CREA: 1.0000 "application " | TOPICAL_CREAM | Freq: Two times a day (BID) | CUTANEOUS | 0 refills | Status: DC

## 2019-03-22 MED ORDER — CHLORHEXIDINE GLUCONATE CLOTH 2 % EX PADS
6.0000 | MEDICATED_PAD | Freq: Every day | CUTANEOUS | Status: DC
  Filled 2019-03-22 (×3): qty 6

## 2019-03-22 MED ORDER — TAMSULOSIN HCL 0.4 MG PO CAPS
0.8000 mg | ORAL_CAPSULE | Freq: Every day | ORAL | Status: DC
  Administered 2019-03-23: 17:00:00 0.8 mg via ORAL
  Filled 2019-03-22 (×3): qty 2

## 2019-03-22 MED ORDER — POLYETHYLENE GLYCOL 3350 17 G PO PACK
17.0000 g | PACK | Freq: Two times a day (BID) | ORAL | Status: DC
  Administered 2019-03-23 – 2019-03-24 (×3): 17 g via ORAL
  Filled 2019-03-22 (×8): qty 1

## 2019-03-22 MED ORDER — HYDROXYZINE HCL 10 MG PO TABS
10.0000 mg | ORAL_TABLET | Freq: Three times a day (TID) | ORAL | Status: DC | PRN
  Administered 2019-03-22 – 2019-03-23 (×3): 10 mg via ORAL
  Filled 2019-03-22 (×3): qty 1

## 2019-03-22 MED ORDER — INFLUENZA VAC SPLIT QUAD 0.5 ML IM SUSY
0.5000 mL | PREFILLED_SYRINGE | INTRAMUSCULAR | Status: AC
  Administered 2019-03-23: 12:00:00 0.5 mL via INTRAMUSCULAR
  Filled 2019-03-22: qty 0.5

## 2019-03-22 NOTE — Progress Notes (Signed)
Patient bladder scan even after voiding more urine throughout night shift was .  Patient stated he wanted to go ahead and I and O cath himself at this time..  With RN instruction patient was able to successfully in and out cath himself for of urine output.
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
## 2019-03-22 NOTE — Care Management Important Message (Signed)
Important Message  Patient Details  Name: GIORGI DEBRUIN MRN: [id number] Date of Birth: 07/12/1960   Medicare Important Message Given:  Yes     Dorena Bodo 03/22/2019, 2:09 PM

## 2019-03-22 NOTE — Tx Team (Signed)
Initial Treatment Plan 03/22/2019 11:56 PM [place] WENZLICK ZOX:[id number]    PATIENT STRESSORS: Financial difficulties Marital or family conflict Medication change or noncompliance Occupational concerns   PATIENT STRENGTHS: General fund of knowledge Motivation for treatment/growth   PATIENT IDENTIFIED PROBLEMS: Psychosis  "nothing"                   DISCHARGE CRITERIA:  Improved stabilization in mood, thinking, and/or behavior Verbal commitment to aftercare and medication compliance  PRELIMINARY DISCHARGE PLAN: Attend aftercare/continuing care group Outpatient therapy  PATIENT/FAMILY INVOLVEMENT: This treatment plan has been presented to and reviewed with the patient, TIAN MCMURTREY.  The patient and family have been given the opportunity to ask questions and make suggestions.  Delos Haring, RN 03/22/2019, 11:56 PM

## 2019-03-22 NOTE — TOC Progression Note (Signed)
Transition of Care [facility]) - Progression Note    Patient Details  Name: Adam Castro MRN: [id number] Date of Birth: 18-Dec-1960  [facility] of [facility]) CM/SW Contact  Eduard Roux, [place] Phone Number: 03/22/2019, 11:23 AM  Clinical Narrative:        CSW contacted [facility]- LVM on their triage line - to contact CSW regarding referral for patient.   Antony Blackbird, MSW, LCSWA Clinical Social Worker     Expected Discharge Plan and Services           Expected Discharge Date: 03/22/19                                     Social Determinants of Health (SDOH) Interventions    Readmission Risk Interventions No flowsheet data found.

## 2019-03-22 NOTE — Progress Notes (Signed)
Patient has been voiding small amounts of urine thus far this shift night.  Bladder scan complete and was .  Patient stated he wanted to wait a little longer to see if he could urinate more on his own.  Patient stated he would notify RN if he became unable to void or if he felt uncomfortable pertaining to bladder fullness.

## 2019-03-22 NOTE — Progress Notes (Signed)
Pt has voided 2 - 3 times about 100 ml each time. Bladder scan done with PVR = 635 ml. Intermittent straight cath done independently by pt with sterile technique and standby supervision only. 550 ml urine out. Tolerates well.

## 2019-03-22 NOTE — TOC Transition Note (Addendum)
Transition of Care [facility]) - CM/SW Discharge Note   Patient Details  Name: Adam Castro MRN: [id number] Date of Birth: 03/13/1960  Transition of Care [facility]) CM/SW Contact:  Eduard Roux, LCSWA Phone Number: 03/22/2019, 5:34 PM   Clinical Narrative:      RN informed to  call -AFTER HOURS Safe Transport  When patient is ready for transport [phone].  Therapist, nutritional signed and placed in chart.  Per [facility]-  accepted to [facility] Adult Unit; 508-bed 2.     Denzil Magnuson NP is the accepting provider.   Dr. Jeannine Kitten is the attending provider.   Call report to [phone].   Aram Beecham, CSW @ [facility] notified.    Pt is Voluntary.   Pt may be transported by [employer], [employer].   Pt scheduledto arrive at [facility] at 9:00pm.        Patient Goals and CMS Choice        Discharge Placement                       Discharge Plan and Services                                     Social Determinants of Health (SDOH) Interventions     Readmission Risk Interventions No flowsheet data found.

## 2019-03-22 NOTE — Progress Notes (Signed)
Pt accepted to [facility] Adult Unit; 508-bed 2.      Denzil Magnuson NP is the accepting provider.    Dr. Jeannine Kitten is the attending provider.    Call report to [phone].   Aram Beecham, CSW @ [facility] notified.     Pt is Voluntary.    Pt may be transported by [employer].   Pt scheduled to arrive at [facility] at 9:00pm.   Drucilla Schmidt, MSW, LCSW-A Clinical Disposition Social Worker [employer] [facility]/TTS [phone]

## 2019-03-23 DIAGNOSIS — F15950 Other stimulant use, unspecified with stimulant-induced psychotic disorder with delusions: Secondary | ICD-10-CM

## 2019-03-23 LAB — LIPID PANEL
Cholesterol: 210 mg/dL — ABNORMAL HIGH (ref 0–200)
HDL: 50 mg/dL (ref >40)
LDL Cholesterol: 137 mg/dL — ABNORMAL HIGH (ref 0–99)
Total CHOL/HDL Ratio: 4.2 RATIO
Triglycerides: 116 mg/dL (ref <150)
VLDL: 23 mg/dL (ref 0–40)

## 2019-03-23 LAB — TSH: TSH: 2.43 u[IU]/mL (ref 0.350–4.500)

## 2019-03-23 LAB — HEMOGLOBIN A1C
Hgb A1c MFr Bld: 6.3 % — ABNORMAL HIGH (ref 4.8–5.6)
Mean Plasma Glucose: 134.11 mg/dL

## 2019-03-23 MED ORDER — METOPROLOL TARTRATE 50 MG PO TABS: 50.0000 mg | ORAL_TABLET | Freq: Two times a day (BID) | ORAL | 2 refills | Status: AC

## 2019-03-23 MED ORDER — BETHANECHOL CHLORIDE 5 MG PO TABS: 5.0000 mg | ORAL_TABLET | Freq: Three times a day (TID) | ORAL | 1 refills | Status: DC

## 2019-03-23 MED ORDER — CHLORHEXIDINE GLUCONATE CLOTH 2 % EX PADS: 6.0000 | MEDICATED_PAD | Freq: Every day | CUTANEOUS | 1 refills | Status: AC

## 2019-03-23 MED ORDER — HYDROCORTISONE 1 % EX CREA: 1.0000 "application " | TOPICAL_CREAM | Freq: Three times a day (TID) | CUTANEOUS | 1 refills | Status: AC | PRN

## 2019-03-23 MED ORDER — METOPROLOL TARTRATE 50 MG PO TABS
50.0000 mg | ORAL_TABLET | Freq: Two times a day (BID) | ORAL | Status: DC
  Administered 2019-03-23 – 2019-03-24 (×2): 50 mg via ORAL
  Filled 2019-03-23 (×6): qty 1
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
## 2019-03-23 NOTE — [facility] Suicide Risk Assessment (Cosign Needed)
[facility] INPATIENT:  Family/Significant Other Suicide Prevention Education  Suicide Prevention Education:  Patient Refusal for Family/Significant Other Suicide Prevention Education: The patient Adam Castro has refused to provide written consent for family/significant other to be provided Family/Significant Other Suicide Prevention Education during admission and/or prior to discharge.  Physician notified.  Reynold Bowen 03/23/2019, 11:18 AM

## 2019-03-23 NOTE — Progress Notes (Signed)
Recreation Therapy Notes  INPATIENT RECREATION THERAPY ASSESSMENT  Patient Details Name: Adam Castro MRN: [id number] DOB: [birth year] Today's Date: 03/23/2019       Information Obtained From: Patient  Able to Participate in Assessment/Interview: Yes  Patient Presentation: Alert  Reason for Admission (Per Patient): Other (Comments)(Pt stated he had been bleeding from a cut and he was having trouble going to the bathroom)  Patient Stressors: (None)  Coping Skills:   Sports, TV, Music, Exercise, Deep Breathing, Talk, Prayer, Avoidance, Read, Hot Bath/Shower  Leisure Interests (2+):  [employer], Technical brewer - [employer] of Recreation/Participation: Other (Comment)(During their respective seasons)  Awareness of Community Resources:  Yes  Community Resources:  Other (Comment)(Lakes; Hunting lands)  Current Use: Yes  If no, Barriers?:    Expressed Interest in [employer] Information: No  County of Residence:  [place]  Patient Main Form of Transportation: Set designer  Patient Strengths:  Build things; Drive bulldozers/machines  Patient Identified Areas of Improvement:  Read more  Patient Goal for Hospitalization:  "get better so I can get home"  Current SI (including self-harm):  No  Current HI:  No  Current AVH: No  Staff Intervention Plan: Group Attendance, Collaborate with Interdisciplinary Treatment Team  Consent to Intern Participation: N/A     Caroll Rancher, LRT/CTRS  Caroll Rancher A 03/23/2019, 12:35 PM
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
## 2019-03-23 NOTE — Tx Team (Signed)
Interdisciplinary Treatment and Diagnostic Plan Update  03/23/2019 Time of Session: 10:15am  Adam Castro MRN: [id number]  Principal Diagnosis: <principal problem not specified>  Secondary Diagnoses: Active Problems:   Schizophrenia (HCC)   Amphetamine and psychostimulant-induced psychosis with delusions (HCC)   Current Medications:  Current Facility-Administered Medications  Medication Dose Route Frequency Provider Last Rate Last Admin  . acetaminophen (TYLENOL) tablet 650 mg  650 mg Oral Q6H PRN Denzil Magnuson, NP       Or  . acetaminophen (TYLENOL) suppository 650 mg  650 mg Rectal Q6H PRN Denzil Magnuson, NP      . bethanechol (URECHOLINE) tablet 5 mg  5 mg Oral TID Denzil Magnuson, NP   5 mg at 03/23/19 5462  . camphor-menthol (SARNA) lotion   Topical PRN Denzil Magnuson, NP      . Chlorhexidine Gluconate Cloth 2 % PADS 6 each  6 each Topical Daily Denzil Magnuson, NP      . clotrimazole (LOTRIMIN) 1 % cream   Topical BID Denzil Magnuson, NP   Given at 03/23/19 [phone]  . finasteride (PROSCAR) tablet 5 mg  5 mg Oral Daily Denzil Magnuson, NP   5 mg at 03/23/19 0093  . hydrocortisone cream 1 % 1 application  1 application Topical TID PRN Denzil Magnuson, NP      . hydrOXYzine (ATARAX/VISTARIL) tablet 10 mg  10 mg Oral TID PRN Denzil Magnuson, NP   10 mg at 03/22/19 2330  . influenza vac split quadrivalent PF (FLUARIX) injection 0.5 mL  0.5 mL Intramuscular Tomorrow-1000 Anike, Adaku C, NP      . metoprolol tartrate (LOPRESSOR) tablet 50 mg  50 mg Oral BID Malvin Johns, MD      . oxyCODONE (Oxy IR/ROXICODONE) immediate release tablet 10-15 mg  10-15 mg Oral Q4H PRN Denzil Magnuson, NP   10 mg at 03/23/19 0816  . polyethylene glycol (MIRALAX / GLYCOLAX) packet 17 g  17 g Oral BID Denzil Magnuson, NP   17 g at 03/23/19 8182  . QUEtiapine (SEROQUEL XR) 24 hr tablet 50 mg  50 mg Oral QHS Denzil Magnuson, NP      . senna-docusate (Senokot-S) tablet 1-2 tablet  1-2 tablet Oral  QHS PRN Denzil Magnuson, NP      . tamsulosin (FLOMAX) capsule 0.8 mg  0.8 mg Oral QPC supper Denzil Magnuson, NP       PTA Medications: Medications Prior to Admission  Medication Sig Dispense Refill Last Dose  . clotrimazole (LOTRIMIN) 1 % cream Apply 1 application topically 2 (two) times daily. Apply to rash on left upper forearm, left upper back, right upper medial thigh and lateral border of left wrist 30 g 0   . finasteride (PROSCAR) 5 MG tablet Take 1 tablet (5 mg total) by mouth daily. 30 tablet 0   . metoprolol tartrate (LOPRESSOR) 25 MG tablet Take 1 tablet (25 mg total) by mouth 2 (two) times daily. 60 tablet 0   . oxyCODONE (OXY IR/ROXICODONE) 5 MG immediate release tablet Take 1 tablet (5 mg total) by mouth every 4 (four) hours as needed for moderate pain or severe pain. 10 tablet 0   . QUEtiapine (SEROQUEL XR) 50 MG TB24 24 hr tablet Take 1 tablet (50 mg total) by mouth at bedtime. 90 tablet 1   . tamsulosin (FLOMAX) 0.4 MG CAPS capsule Take 2 capsules (0.8 mg total) by mouth daily after supper. 30 capsule 0     Patient Stressors: Financial difficulties Marital or family conflict Medication  change or noncompliance Occupational concerns  Patient Strengths: Technical sales engineer for treatment/growth  Treatment Modalities: Medication Management, Group therapy, Case management,  1 to 1 session with clinician, Psychoeducation, Recreational therapy.   Physician Treatment Plan for Primary Diagnosis: <principal problem not specified> Long Term Goal(s):     Short Term Goals:    Medication Management: Evaluate patient's response, side effects, and tolerance of medication regimen.  Therapeutic Interventions: 1 to 1 sessions, Unit Group sessions and Medication administration.  Evaluation of Outcomes: Progressing  Physician Treatment Plan for Secondary Diagnosis: Active Problems:   Schizophrenia ([place])   Amphetamine and psychostimulant-induced psychosis with  delusions ([place])  Long Term Goal(s):     Short Term Goals:       Medication Management: Evaluate patient's response, side effects, and tolerance of medication regimen.  Therapeutic Interventions: 1 to 1 sessions, Unit Group sessions and Medication administration.  Evaluation of Outcomes: Progressing   RN Treatment Plan for Primary Diagnosis: <principal problem not specified> Long Term Goal(s): Knowledge of disease and therapeutic regimen to maintain health will improve  Short Term Goals: Ability to participate in decision making will improve, Ability to verbalize feelings will improve, Ability to disclose and discuss suicidal ideas, Ability to identify and develop effective coping behaviors will improve and Compliance with prescribed medications will improve  Medication Management: RN will administer medications as ordered by provider, will assess and evaluate patient's response and provide education to patient for prescribed medication. RN will report any adverse and/or side effects to prescribing provider.  Therapeutic Interventions: 1 on 1 counseling sessions, Psychoeducation, Medication administration, Evaluate responses to treatment, Monitor vital signs and CBGs as ordered, Perform/monitor CIWA, COWS, AIMS and Fall Risk screenings as ordered, Perform wound care treatments as ordered.  Evaluation of Outcomes: Progressing   LCSW Treatment Plan for Primary Diagnosis: <principal problem not specified> Long Term Goal(s): Safe transition to appropriate next level of care at discharge, Engage patient in therapeutic group addressing interpersonal concerns.  Short Term Goals: Engage patient in aftercare planning with referrals and resources and Increase skills for wellness and recovery  Therapeutic Interventions: Assess for all discharge needs, 1 to 1 time with Social worker, Explore available resources and support systems, Assess for adequacy in community support network, Educate family and  significant other(s) on suicide prevention, Complete Psychosocial Assessment, Interpersonal group therapy.  Evaluation of Outcomes: Progressing   Progress in Treatment: Attending groups: No. new to unit  Participating in groups: No. Taking medication as prescribed: No. new to unit  Toleration medication: No. Family/Significant other contact made: No, will contact:  if given consent  Patient understands diagnosis: Yes. Discussing patient identified problems/goals with staff: Yes. Medical problems stabilized or resolved: No. Wound care  Denies suicidal/homicidal ideation: Yes. Issues/concerns per patient self-inventory: No. Other:   New problem(s) identified: No, Describe:  none   New Short Term/Long Term Goal(s): Medication stabilization, elimination of SI thoughts, and development of a comprehensive mental wellness plan.    Patient Goals:  "See what's wrong and get home"  Discharge Plan or Barriers: CSW will continue to follow up for appropriate referrals and possible discharge planning  Reason for Continuation of Hospitalization: Medical Issues  Estimated Length of Stay: 3-5 days   Attendees: Patient: Adam Castro  03/23/2019   Physician: Dr. Jake Samples, MD 03/23/2019   Nursing: Will, RN 03/23/2019   RN Care Manager: 03/23/2019   Social Worker: Ardelle Anton, Vivian  03/23/2019   Recreational Therapist:  03/23/2019   Other: Sharl Ma, NP intern  03/23/2019   Other: Earlyne Iba, MSW intern  03/23/2019   Other: 03/23/2019     Scribe for Treatment Team: Reynold Bowen, Student-Social Work 03/23/2019 10:41 AM

## 2019-03-23 NOTE — [facility] Suicide Risk Assessment (Signed)
[facility] Admission Suicide Risk Assessment   Nursing information obtained from:  Patient Demographic factors:  Male, Low socioeconomic status, Unemployed Current Mental Status:  NA Loss Factors:  Financial problems / change in socioeconomic status Historical Factors:  NA Risk Reduction Factors:  NA  Total Time spent with patient: 45 minutes Principal Problem: <principal problem not specified> Diagnosis:  Active Problems:   Schizophrenia (HCC)   Amphetamine and psychostimulant-induced psychosis with delusions (HCC)  Subjective Data: See HPI  Continued Clinical Symptoms:  Alcohol Use Disorder Identification Test Final Score (AUDIT): 3 The "Alcohol Use Disorders Identification Test", Guidelines for Use in Primary Care, Second Edition.  World Science writer [facility]). Score between 0-7:  no or low risk or alcohol related problems. Score between 8-15:  moderate risk of alcohol related problems. Score between 16-19:  high risk of alcohol related problems. Score 20 or above:  warrants further diagnostic evaluation for alcohol dependence and treatment.   CLINICAL FACTORS:   Medical Diagnoses and Treatments/Surgeries  Musculoskeletal: Strength & Muscle Tone: within normal limits Gait & Station: normal Patient leans: N/A  Psychiatric Specialty Exam: Physical Exam  Review of Systems  Blood pressure (!) 140/100, pulse (!) 102, temperature 97.9 F (36.6 C), temperature source Oral, resp. rate 18, height 6\' 1"  (1.854 m), weight 90.7 kg, SpO2 100 %.Body mass index is 26.39 kg/m.  General Appearance: Casual  Eye Contact:  Good  Speech:  Clear and Coherent  Volume:  Normal  Mood:  Euthymic  Affect:  Appropriate  Thought Process:  Coherent, Goal Directed and Descriptions of Associations: Intact  Orientation:  Full (Time, Place, and Person)  Thought Content:  Logical  Suicidal Thoughts:  No  Homicidal Thoughts:  No  Memory:  Immediate;   Good Recent;   Good Remote;   Good  Judgement:   Good  Insight: Good however clearly has a memory lapse related to the incident  Psychomotor Activity:  Normal  Concentration:  Concentration: Fair and Attention Span: Good  Recall:  Fair  Fund of Knowledge:  Good  Language:  Fair  Akathisia:  Negative  Handed:  Right  AIMS (if indicated):     Assets:  Leisure Time Physical Health  ADL's:  Intact  Cognition:  WNL  Sleep:        COGNITIVE FEATURES THAT CONTRIBUTE TO RISK:  None    SUICIDE RISK:   Minimal: No identifiable suicidal ideation.  Patients presenting with no risk factors but with morbid ruminations; may be classified as minimal risk based on the severity of the depressive symptoms  PLAN OF CARE: Monitor overnight discharge in the morning  I certify that inpatient services furnished can reasonably be expected to improve the patient's condition.   , MD 03/23/2019, 10:49 AM

## 2019-03-23 NOTE — Progress Notes (Signed)
Adult Psychoeducational Group Note  Date:  03/23/2019 Time:  11:54 PM  Group Topic/Focus:  Wrap-Up Group:   The focus of this group is to help patients review their daily goal of treatment and discuss progress on daily workbooks.  Participation Level:  Active  Participation Quality:  Appropriate  Affect:  Appropriate  Cognitive:  Appropriate  Insight: Appropriate  Engagement in Group:  Developing/Improving  Modes of Intervention:  Discussion  Additional Comments:  Pt stated his goal for today was to focus on his treatment plan. Pt stated he accomplished his goal today. Pt stated his relationship with his family has improved since he was admitted. Pt stated been able to contact his family friend today help improve his day. Pt stated he felt better about himself today. Pt rated his overall day an 5 out of 10. Pt stated his appetite was pretty good today. Pt stated his sleep last night was pretty fair. Pt nurse was made aware of the situation.  Pt stated he was in  physical pain today. Pt nurse was made aware of the situation. Pt deny auditory or visual hallucinations. Pt denies thoughts of harming himself or others. Pt stated he would alert staff if anything changes.   Felipa Furnace 03/23/2019, 11:54 PM
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
## 2019-03-23 NOTE — Progress Notes (Signed)
Recreation Therapy Notes  Date: 1.29.21 Time: 1000 Location: 500 Hall Dayroom  Group Topic: Wellness  Goal Area(s) Addresses:  Patient will define components of whole wellness. Patient will verbalize benefit of whole wellness.  Intervention:  Music  Activity: Get Moving.  LRT and patients went through a series of stretches to get loosened up.  The members to the group took turns leading exercise or dance moves.  Patients could choose any appropriate movement to lead the group in as long as it got yu moving.  Education: Wellness, Building control surveyor.   Education Outcome: Acknowledges education/In group clarification offered/Needs additional education.   Clinical Observations/Feedback: Pt did not attend group session.    Caroll Rancher, LRT/CTRS         Caroll Rancher A 03/23/2019 12:06 PM

## 2019-03-24 DIAGNOSIS — F200 Paranoid schizophrenia: Secondary | ICD-10-CM

## 2019-03-24 LAB — PROLACTIN: Prolactin: 15.9 ng/mL — ABNORMAL HIGH (ref 4.0–15.2)

## 2019-03-24 MED ORDER — CLOTRIMAZOLE 1 % EX CREA: 1.0000 "application " | TOPICAL_CREAM | Freq: Two times a day (BID) | CUTANEOUS | 0 refills | Status: AC

## 2019-03-24 MED ORDER — BETHANECHOL CHLORIDE 5 MG PO TABS: 5.0000 mg | ORAL_TABLET | Freq: Three times a day (TID) | ORAL | 1 refills | Status: AC

## 2019-03-24 MED ORDER — QUETIAPINE FUMARATE ER 50 MG PO TB24: 100.0000 mg | ORAL_TABLET | Freq: Every day | ORAL | 0 refills | Status: AC

## 2019-03-24 MED ORDER — QUETIAPINE FUMARATE ER 50 MG PO TB24
100.0000 mg | ORAL_TABLET | Freq: Every day | ORAL | Status: DC
  Filled 2019-03-24 (×2): qty 2

## 2019-03-24 MED ORDER — TAMSULOSIN HCL 0.4 MG PO CAPS: 0.8000 mg | ORAL_CAPSULE | Freq: Every day | ORAL | 0 refills | Status: AC

## 2019-03-24 MED ORDER — FINASTERIDE 5 MG PO TABS: 5.0000 mg | ORAL_TABLET | Freq: Every day | ORAL | 0 refills | Status: AC

## 2019-03-24 NOTE — Progress Notes (Signed)
Pt slept 0.75 hrs. Pt up q 4 hrs requesting Oxy

## 2019-03-24 NOTE — Progress Notes (Signed)
Pt received both written and verbal discharge instructions. Pt verbalized understanding of discharge instructions. Pt declined f/u care. Pt agreed to medication regimen. The pt was informed that the doctor will not be giving him a prescription for pain meds. The pt verbalized understanding. Pt gathered belongings from his room and locker. The pt was safely discharged to the lobby.
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
## 2019-03-24 NOTE — Progress Notes (Signed)
   03/24/19 0000  Psych Admission Type (Psych Patients Only)  Admission Status Involuntary  Psychosocial Assessment  Patient Complaints Worrying  Eye Contact Fair  Facial Expression Anxious  Affect Appropriate to circumstance  Speech Logical/coherent  Interaction Assertive  Motor Activity Slow  Appearance/Hygiene Disheveled  Behavior Characteristics Cooperative  Aggressive Behavior  Effect No apparent injury  Thought Process  Coherency Circumstantial  Content Preoccupation  Delusions Paranoid  Perception WDL  Hallucination None reported or observed  Judgment Impaired  Confusion WDL  Danger to Self  Current suicidal ideation? Denies  Danger to Others  Danger to Others None reported or observed   Pt focused on his pain medication

## 2019-03-24 NOTE — Progress Notes (Signed)
  [facility] Adult Case Management Discharge Plan :  Will you be returning to the same living situation after discharge:  Yes,  lives alone At discharge, do you have transportation home?: Yes,  arranged by patient Do you have the ability to pay for your medications: No.  Refuses referral to anywhere that could assist  Release of information consent forms completed and in emailed to Medical Records, then turned in to Medical Records by CSW.   Patient to Follow up at: Follow-up Information    Patient declines all follow-up. Follow up.           Next level of care provider has access to [facility] Health Link:  Not Applicable  Safety Planning and Suicide Prevention discussed: No.  Patient declined  Have you used any form of tobacco in the last 30 days? (Cigarettes, Smokeless Tobacco, Cigars, and/or Pipes): No  Has patient been referred to the Quitline?: N/A patient is not a smoker  Patient has been referred for addiction treatment: Pt. refused referral  Lynnell Chad, LCSW 03/24/2019, 8:54 AM

## 2019-03-24 NOTE — [facility] Suicide Risk Assessment (Signed)
[facility] Discharge Suicide Risk Assessment   Principal Problem: <principal problem not specified> Discharge Diagnoses: Active Problems:   Schizophrenia ([facility])   Amphetamine and psychostimulant-induced psychosis with delusions ([facility])   Total Time spent with patient: 30 minutes  Musculoskeletal: Strength & Muscle Tone: within normal limits Gait & Station: normal Patient leans: N/A  Psychiatric Specialty Exam: Review of Systems  Musculoskeletal: Positive for arthralgias.  All other systems reviewed and are negative.   Blood pressure 115/90, pulse (!) 108, temperature 97.9 F (36.6 C), temperature source Oral, resp. rate 18, height 6\' 1"  (1.854 m), weight 90.7 kg, SpO2 100 %.Body mass index is 26.39 kg/m.  General Appearance: Casual  Eye Contact::  Fair  Speech:  Normal Rate409  Volume:  Normal  Mood:  Euthymic  Affect:  Congruent  Thought Process:  Coherent and Descriptions of Associations: Circumstantial  Orientation:  Full (Time, Place, and Person)  Thought Content:  Logical  Suicidal Thoughts:  No  Homicidal Thoughts:  No  Memory:  Immediate;   Fair Recent;   Fair Remote;   Fair  Judgement:  Intact  Insight:  Lacking  Psychomotor Activity:  Normal  Concentration:  Fair  Recall:  [phone] of Knowledge:Fair  Language: Good  Akathisia:  Negative  Handed:  Right  AIMS (if indicated):     Assets:  Desire for Improvement Resilience  Sleep:  Number of Hours: 0.75  Cognition: WNL  ADL's:  Intact   Mental Status Per Nursing Assessment::   On Admission:  NA  Demographic Factors:  Male, Divorced or widowed, Living alone and Unemployed  Loss Factors: Financial problems/change in socioeconomic status  Historical Factors: Impulsivity  Risk Reduction Factors:   NA  Continued Clinical Symptoms:  Alcohol/Substance Abuse/Dependencies Schizophrenia:   Paranoid or undifferentiated type  Cognitive Features That Contribute To Risk:  None    Suicide Risk:  Minimal: No  identifiable suicidal ideation.  Patients presenting with no risk factors but with morbid ruminations; may be classified as minimal risk based on the severity of the depressive symptoms  Follow-up Information    Patient declines all follow-up. Follow up.           Plan Of Care/Follow-up recommendations:  Activity:  ad lib  [phone], MD 03/24/2019, 10:06 AM

## 2019-03-24 NOTE — [facility] Group Notes (Signed)
  [facility] LCSW Group Therapy Note  Date/Time:  03/24/2019 11:15AM-12:00PM  Type of Therapy and Topic:  Group Therapy:  Feelings About Hospitalization  Participation Level:  Did Not Attend   Description of Group This process group involved patients discussing their feelings related to being hospitalized, as well as the benefits they see to being in the hospital.  These feelings and benefits were itemized.  The group then brainstormed specific ways in which they could seek those same benefits when they discharge and return home.  Therapeutic Goals 1. Patient will identify and describe positive and negative feelings related to hospitalization 2. Patient will verbalize benefits of hospitalization to themselves personally 3. Patients will brainstorm together ways they can obtain similar benefits in the outpatient setting, identify barriers to wellness and possible solutions  Summary of Patient Progress:  The patient did not attend group.  Therapeutic Modalities Cognitive Behavioral Therapy Motivational Interviewing    Ambrose Mantle, LCSW 03/24/2019, 9:12 AM

## 2019-03-26 NOTE — Progress Notes (Signed)
Spirituality group facilitated by Wilkie Aye, MDiv, [facility].  Group Description:  Group focused on topic of hope.  Patients participated in facilitated discussion around topic, connecting with one another around experiences and definitions for hope.  Group members engaged with visual explorer photos, reflecting on what hope looks like for them today.  Group engaged in discussion around how their definitions of hope are present today in hospital.   Modalities: Psycho-social ed, Adlerian, Narrative, MI Patient Progress: Present throughout group, Adam Castro was engaged in group discussion - reflecting on hopes around his discharge.

## 2019-05-15 DIAGNOSIS — I10 Essential (primary) hypertension: Secondary | ICD-10-CM | POA: Diagnosis not present

## 2019-05-15 DIAGNOSIS — N179 Acute kidney failure, unspecified: Secondary | ICD-10-CM | POA: Diagnosis not present

## 2019-05-15 DIAGNOSIS — G9341 Metabolic encephalopathy: Secondary | ICD-10-CM | POA: Diagnosis not present

## 2019-05-15 DIAGNOSIS — F1510 Other stimulant abuse, uncomplicated: Secondary | ICD-10-CM | POA: Diagnosis not present

## 2019-05-16 DIAGNOSIS — F1510 Other stimulant abuse, uncomplicated: Secondary | ICD-10-CM | POA: Diagnosis not present

## 2019-05-16 DIAGNOSIS — N179 Acute kidney failure, unspecified: Secondary | ICD-10-CM | POA: Diagnosis not present

## 2019-05-16 DIAGNOSIS — I10 Essential (primary) hypertension: Secondary | ICD-10-CM | POA: Diagnosis not present

## 2019-05-16 DIAGNOSIS — G9341 Metabolic encephalopathy: Secondary | ICD-10-CM | POA: Diagnosis not present

## 2019-05-17 DIAGNOSIS — F1510 Other stimulant abuse, uncomplicated: Secondary | ICD-10-CM | POA: Diagnosis not present

## 2019-05-17 DIAGNOSIS — I10 Essential (primary) hypertension: Secondary | ICD-10-CM | POA: Diagnosis not present

## 2019-05-17 DIAGNOSIS — G9341 Metabolic encephalopathy: Secondary | ICD-10-CM | POA: Diagnosis not present

## 2019-05-17 DIAGNOSIS — N179 Acute kidney failure, unspecified: Secondary | ICD-10-CM | POA: Diagnosis not present

## 2019-05-19 DIAGNOSIS — I10 Essential (primary) hypertension: Secondary | ICD-10-CM | POA: Diagnosis not present

## 2019-05-19 DIAGNOSIS — N179 Acute kidney failure, unspecified: Secondary | ICD-10-CM | POA: Diagnosis not present

## 2019-05-19 DIAGNOSIS — F1510 Other stimulant abuse, uncomplicated: Secondary | ICD-10-CM | POA: Diagnosis not present

## 2019-05-19 DIAGNOSIS — G9341 Metabolic encephalopathy: Secondary | ICD-10-CM | POA: Diagnosis not present

## 2019-05-20 DIAGNOSIS — I10 Essential (primary) hypertension: Secondary | ICD-10-CM | POA: Diagnosis not present

## 2019-05-20 DIAGNOSIS — F1510 Other stimulant abuse, uncomplicated: Secondary | ICD-10-CM | POA: Diagnosis not present

## 2019-05-20 DIAGNOSIS — N179 Acute kidney failure, unspecified: Secondary | ICD-10-CM | POA: Diagnosis not present

## 2019-05-20 DIAGNOSIS — G9341 Metabolic encephalopathy: Secondary | ICD-10-CM | POA: Diagnosis not present

## 2019-05-21 DIAGNOSIS — F1510 Other stimulant abuse, uncomplicated: Secondary | ICD-10-CM | POA: Diagnosis not present

## 2019-05-21 DIAGNOSIS — I10 Essential (primary) hypertension: Secondary | ICD-10-CM | POA: Diagnosis not present

## 2019-05-21 DIAGNOSIS — G9341 Metabolic encephalopathy: Secondary | ICD-10-CM | POA: Diagnosis not present

## 2019-05-21 DIAGNOSIS — N179 Acute kidney failure, unspecified: Secondary | ICD-10-CM | POA: Diagnosis not present

## 2019-05-22 DIAGNOSIS — G9341 Metabolic encephalopathy: Secondary | ICD-10-CM | POA: Diagnosis not present

## 2019-05-22 DIAGNOSIS — F1510 Other stimulant abuse, uncomplicated: Secondary | ICD-10-CM | POA: Diagnosis not present

## 2019-05-22 DIAGNOSIS — I10 Essential (primary) hypertension: Secondary | ICD-10-CM | POA: Diagnosis not present

## 2019-05-22 DIAGNOSIS — N179 Acute kidney failure, unspecified: Secondary | ICD-10-CM | POA: Diagnosis not present

## 2019-05-24 DIAGNOSIS — G9341 Metabolic encephalopathy: Secondary | ICD-10-CM | POA: Diagnosis not present

## 2019-05-24 DIAGNOSIS — I10 Essential (primary) hypertension: Secondary | ICD-10-CM | POA: Diagnosis not present

## 2019-05-24 DIAGNOSIS — N179 Acute kidney failure, unspecified: Secondary | ICD-10-CM | POA: Diagnosis not present

## 2019-05-24 DIAGNOSIS — F1510 Other stimulant abuse, uncomplicated: Secondary | ICD-10-CM | POA: Diagnosis not present

## 2019-05-25 DIAGNOSIS — N179 Acute kidney failure, unspecified: Secondary | ICD-10-CM | POA: Diagnosis not present

## 2019-05-25 DIAGNOSIS — F1510 Other stimulant abuse, uncomplicated: Secondary | ICD-10-CM | POA: Diagnosis not present

## 2019-05-25 DIAGNOSIS — G9341 Metabolic encephalopathy: Secondary | ICD-10-CM | POA: Diagnosis not present

## 2019-05-25 DIAGNOSIS — I10 Essential (primary) hypertension: Secondary | ICD-10-CM | POA: Diagnosis not present

## 2019-05-26 DIAGNOSIS — F1510 Other stimulant abuse, uncomplicated: Secondary | ICD-10-CM | POA: Diagnosis not present

## 2019-05-26 DIAGNOSIS — G9341 Metabolic encephalopathy: Secondary | ICD-10-CM | POA: Diagnosis not present

## 2019-05-26 DIAGNOSIS — N179 Acute kidney failure, unspecified: Secondary | ICD-10-CM | POA: Diagnosis not present

## 2019-05-26 DIAGNOSIS — I10 Essential (primary) hypertension: Secondary | ICD-10-CM | POA: Diagnosis not present

## 2019-05-27 DIAGNOSIS — N179 Acute kidney failure, unspecified: Secondary | ICD-10-CM | POA: Diagnosis not present

## 2019-05-27 DIAGNOSIS — I10 Essential (primary) hypertension: Secondary | ICD-10-CM | POA: Diagnosis not present

## 2019-05-27 DIAGNOSIS — F1510 Other stimulant abuse, uncomplicated: Secondary | ICD-10-CM | POA: Diagnosis not present

## 2019-05-27 DIAGNOSIS — G9341 Metabolic encephalopathy: Secondary | ICD-10-CM | POA: Diagnosis not present

## 2019-05-28 DIAGNOSIS — N179 Acute kidney failure, unspecified: Secondary | ICD-10-CM | POA: Diagnosis not present

## 2019-05-28 DIAGNOSIS — F1510 Other stimulant abuse, uncomplicated: Secondary | ICD-10-CM | POA: Diagnosis not present

## 2019-05-28 DIAGNOSIS — G9341 Metabolic encephalopathy: Secondary | ICD-10-CM | POA: Diagnosis not present

## 2019-05-28 DIAGNOSIS — I10 Essential (primary) hypertension: Secondary | ICD-10-CM | POA: Diagnosis not present

## 2019-05-29 DIAGNOSIS — N179 Acute kidney failure, unspecified: Secondary | ICD-10-CM | POA: Diagnosis not present

## 2019-05-29 DIAGNOSIS — G9341 Metabolic encephalopathy: Secondary | ICD-10-CM | POA: Diagnosis not present

## 2019-05-29 DIAGNOSIS — F1510 Other stimulant abuse, uncomplicated: Secondary | ICD-10-CM | POA: Diagnosis not present

## 2019-05-29 DIAGNOSIS — I10 Essential (primary) hypertension: Secondary | ICD-10-CM | POA: Diagnosis not present

## 2019-05-30 DIAGNOSIS — G9341 Metabolic encephalopathy: Secondary | ICD-10-CM | POA: Diagnosis not present

## 2019-05-30 DIAGNOSIS — F1510 Other stimulant abuse, uncomplicated: Secondary | ICD-10-CM | POA: Diagnosis not present

## 2019-05-30 DIAGNOSIS — I10 Essential (primary) hypertension: Secondary | ICD-10-CM | POA: Diagnosis not present

## 2019-05-30 DIAGNOSIS — N179 Acute kidney failure, unspecified: Secondary | ICD-10-CM | POA: Diagnosis not present

## 2019-05-31 DIAGNOSIS — N179 Acute kidney failure, unspecified: Secondary | ICD-10-CM | POA: Diagnosis not present

## 2019-05-31 DIAGNOSIS — G9341 Metabolic encephalopathy: Secondary | ICD-10-CM | POA: Diagnosis not present

## 2019-05-31 DIAGNOSIS — I10 Essential (primary) hypertension: Secondary | ICD-10-CM | POA: Diagnosis not present

## 2019-05-31 DIAGNOSIS — F1510 Other stimulant abuse, uncomplicated: Secondary | ICD-10-CM | POA: Diagnosis not present

## 2019-06-01 DIAGNOSIS — N179 Acute kidney failure, unspecified: Secondary | ICD-10-CM | POA: Diagnosis not present

## 2019-06-01 DIAGNOSIS — I10 Essential (primary) hypertension: Secondary | ICD-10-CM | POA: Diagnosis not present

## 2019-06-01 DIAGNOSIS — F1510 Other stimulant abuse, uncomplicated: Secondary | ICD-10-CM | POA: Diagnosis not present

## 2019-06-01 DIAGNOSIS — G9341 Metabolic encephalopathy: Secondary | ICD-10-CM | POA: Diagnosis not present

## 2019-06-02 DIAGNOSIS — N179 Acute kidney failure, unspecified: Secondary | ICD-10-CM | POA: Diagnosis not present

## 2019-06-02 DIAGNOSIS — F1510 Other stimulant abuse, uncomplicated: Secondary | ICD-10-CM | POA: Diagnosis not present

## 2019-06-02 DIAGNOSIS — I10 Essential (primary) hypertension: Secondary | ICD-10-CM | POA: Diagnosis not present

## 2019-06-02 DIAGNOSIS — G9341 Metabolic encephalopathy: Secondary | ICD-10-CM | POA: Diagnosis not present

## 2019-06-03 DIAGNOSIS — I10 Essential (primary) hypertension: Secondary | ICD-10-CM | POA: Diagnosis not present

## 2019-06-03 DIAGNOSIS — F1510 Other stimulant abuse, uncomplicated: Secondary | ICD-10-CM | POA: Diagnosis not present

## 2019-06-03 DIAGNOSIS — N179 Acute kidney failure, unspecified: Secondary | ICD-10-CM | POA: Diagnosis not present

## 2019-06-03 DIAGNOSIS — G9341 Metabolic encephalopathy: Secondary | ICD-10-CM | POA: Diagnosis not present
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
## 2021-05-01 IMAGING — US US ABDOMEN LIMITED
1 series · 14 of 25 positions shown · non-contrast
Comparison: CT abdomen pelvis dated 02/28/2019.

CLINICAL DATA: 58-year-old male with abdominal pain.

EXAM:
ULTRASOUND ABDOMEN LIMITED RIGHT UPPER QUADRANT

[Series 1: us abdomen limited · 14 of 56 slices shown]
[im 1/56]
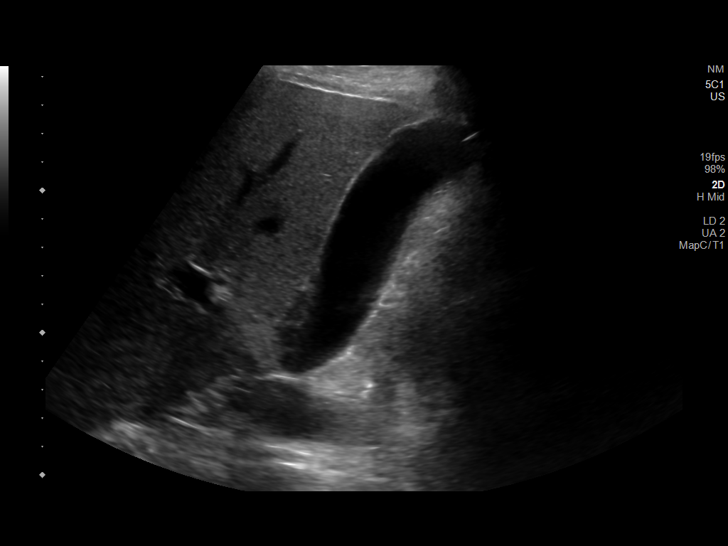
[im 5/56]
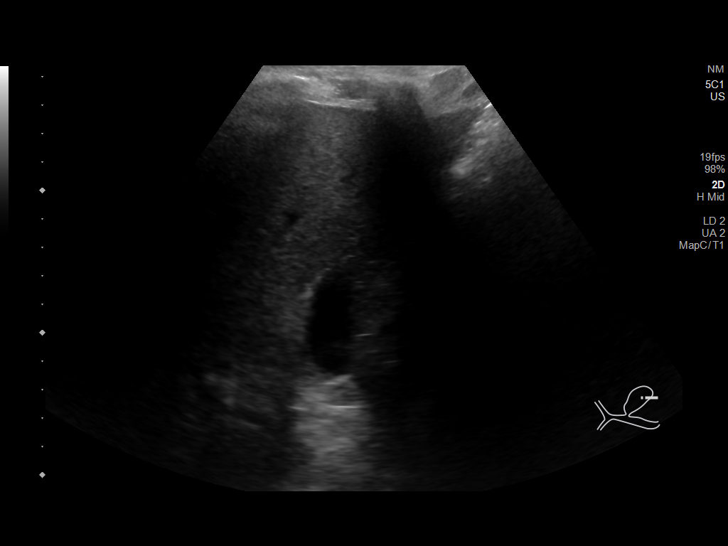
[im 10/56]
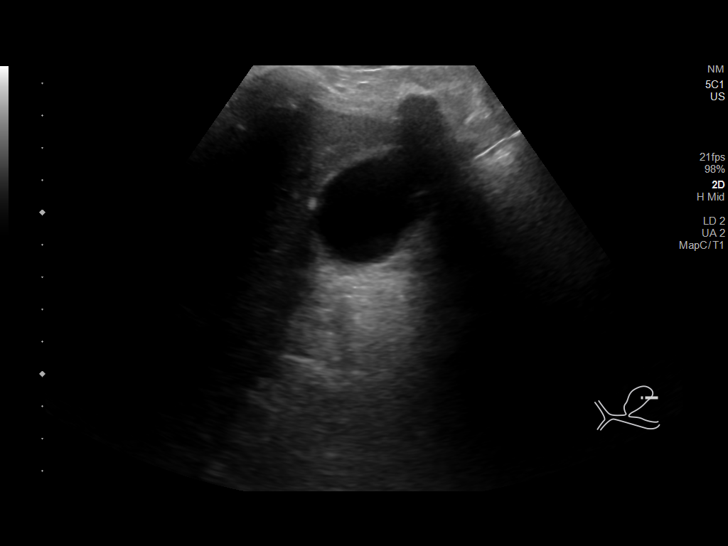
[im 14/56]
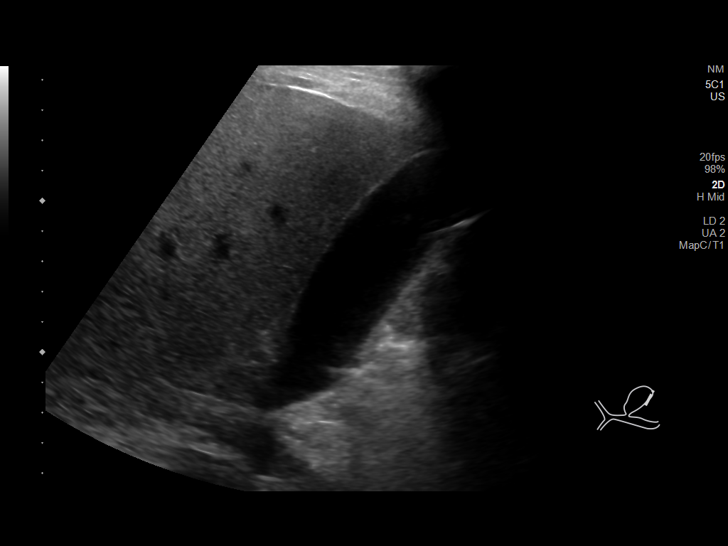
[im 19/56]
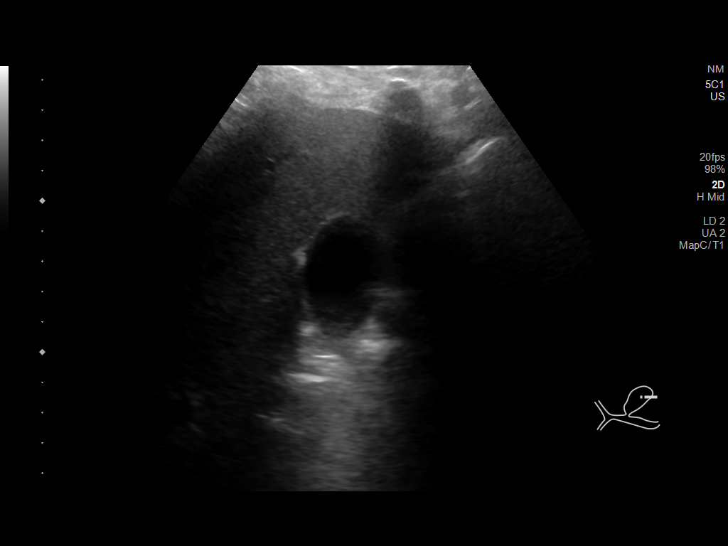
[im 21/56]
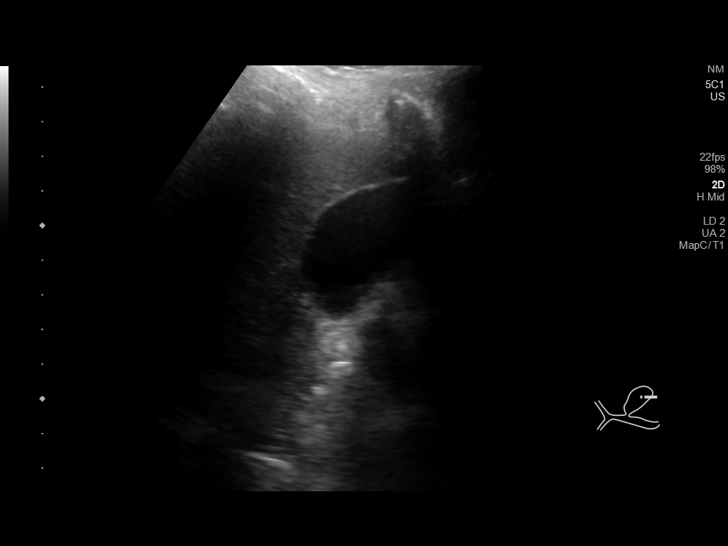
[im 26/56]
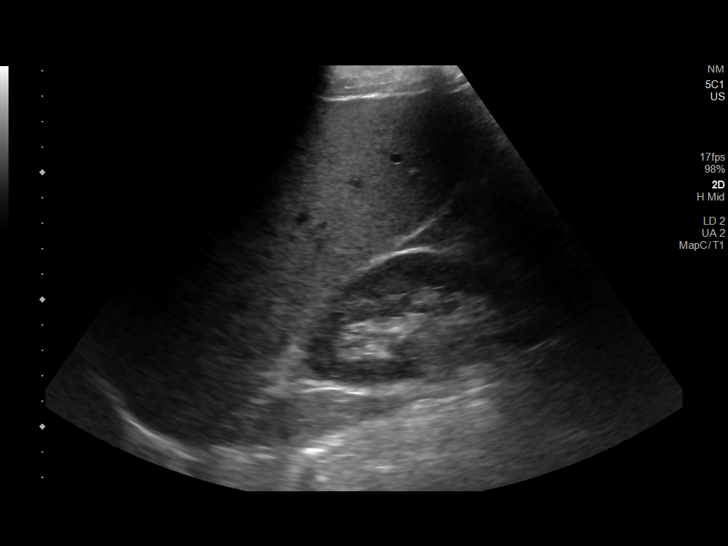
[im 30/56]
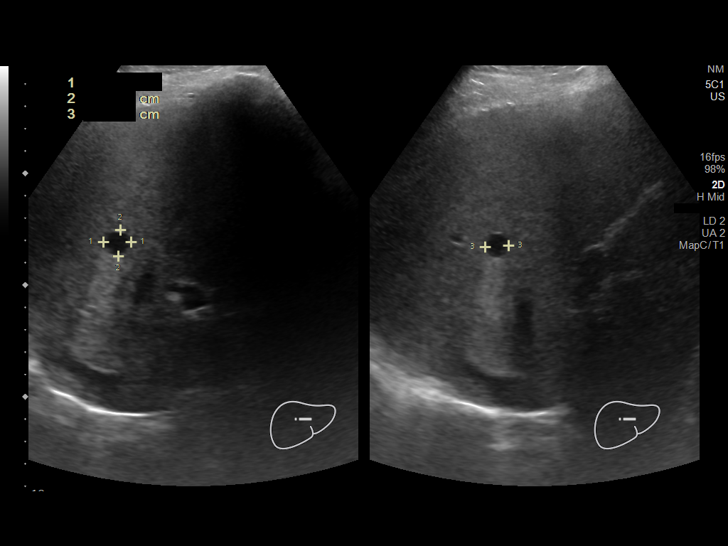
[im 35/56]
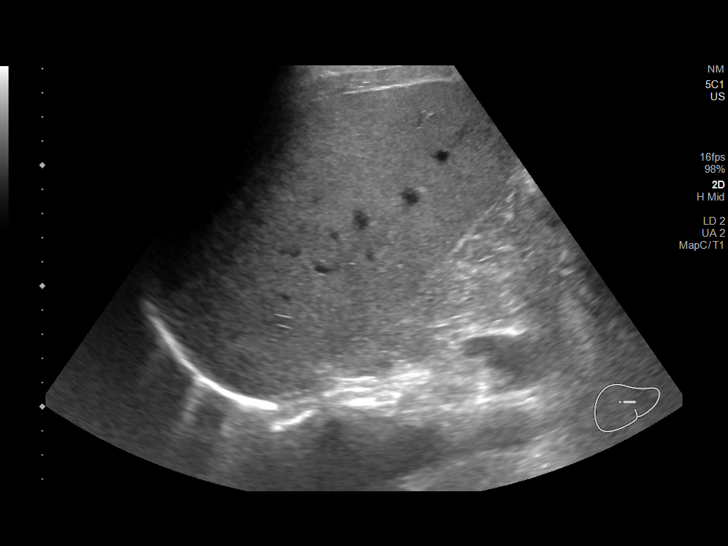
[im 37/56]
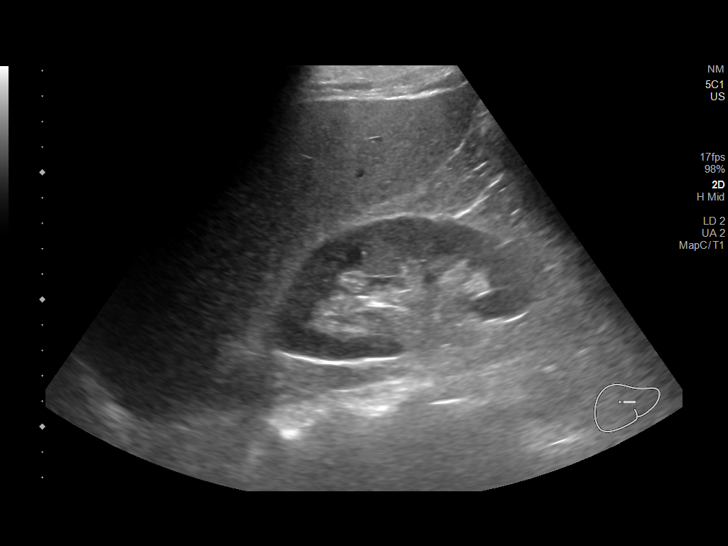
[im 42/56]
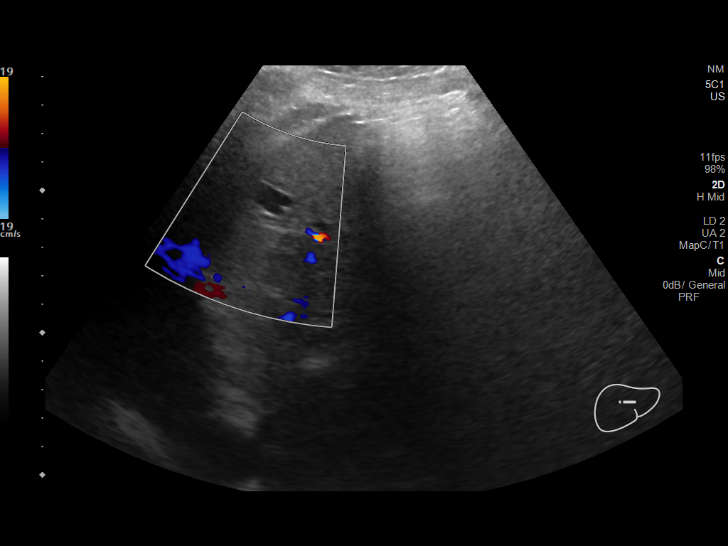
[im 46/56]
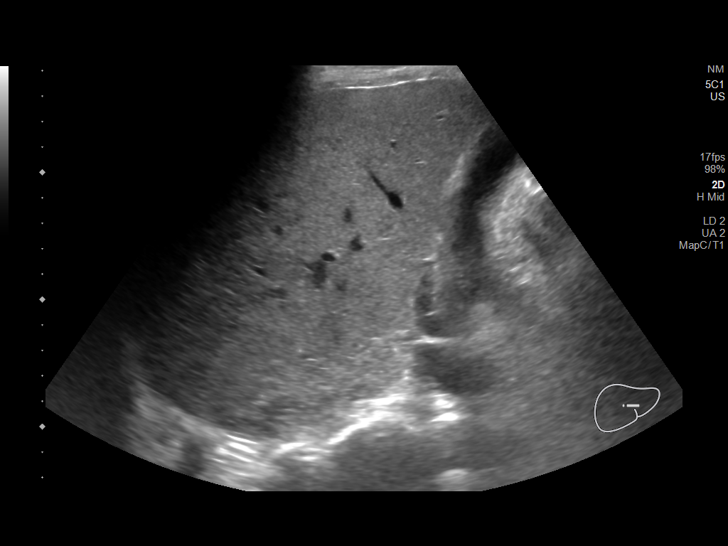
[im 51/56]
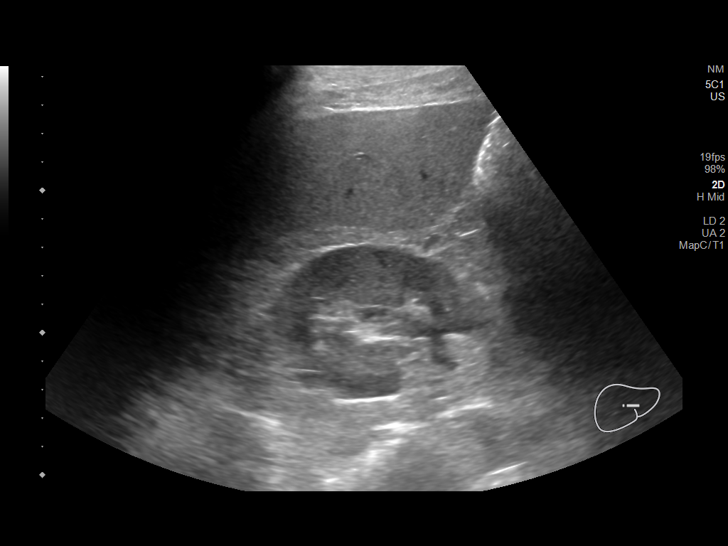
[im 56/56]
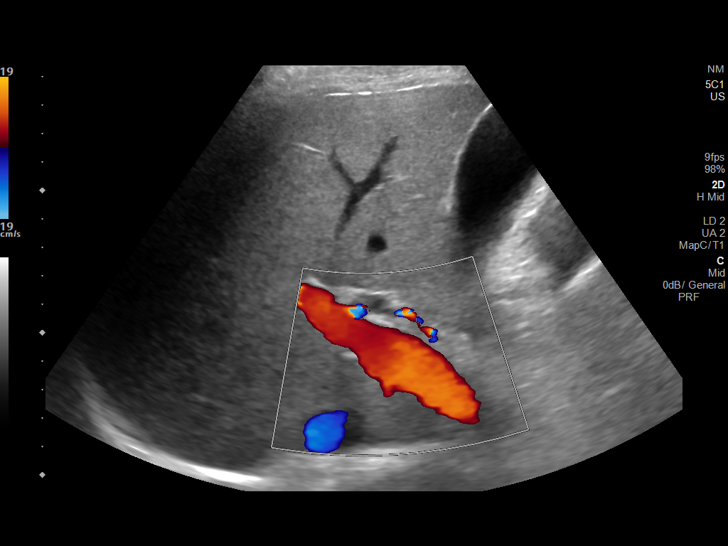

[14 of 25 positions shown; findings below may reference images not displayed]

FINDINGS: Gallbladder:

No gallstone, gallbladder wall thickening, or pericholecystic fluid.
Focal area of increased echogenicity along the anterior gallbladder
wall may represent an area of adenomyomatosis.

Common bile duct:

Diameter: 7 mm

Liver:

There is mild diffuse increased liver echogenicity most commonly
seen in the setting of fatty infiltration. Superimposed inflammation
or fibrosis is not excluded. Clinical correlation is recommended.
There is a 1.5 x 1.1 x 1.4 cm cyst in the left lobe of the liver
with a thin septation. A smaller cyst is noted in the right lobe of
the liver. Portal vein is patent on color Doppler imaging with
normal direction of blood flow towards the liver.

Other: None.
IMPRESSION: 1. No gallstone.
2. Mild fatty liver.
3. Patent main portal vein with hepatopetal flow.

## 2022-09-05 DIAGNOSIS — R079 Chest pain, unspecified: Secondary | ICD-10-CM

## 2022-09-05 DIAGNOSIS — I10 Essential (primary) hypertension: Secondary | ICD-10-CM | POA: Diagnosis not present

## 2022-09-05 DIAGNOSIS — Z8679 Personal history of other diseases of the circulatory system: Secondary | ICD-10-CM | POA: Diagnosis not present

## 2022-09-05 DIAGNOSIS — F1510 Other stimulant abuse, uncomplicated: Secondary | ICD-10-CM | POA: Diagnosis not present

## 2022-09-06 DIAGNOSIS — Z8679 Personal history of other diseases of the circulatory system: Secondary | ICD-10-CM | POA: Diagnosis not present

## 2022-09-06 DIAGNOSIS — R079 Chest pain, unspecified: Secondary | ICD-10-CM | POA: Diagnosis not present

## 2022-09-06 DIAGNOSIS — F1510 Other stimulant abuse, uncomplicated: Secondary | ICD-10-CM | POA: Diagnosis not present

## 2022-09-06 DIAGNOSIS — I10 Essential (primary) hypertension: Secondary | ICD-10-CM | POA: Diagnosis not present

## 2022-09-22 ENCOUNTER — Encounter: Payer: Self-pay | Admitting: Internal Medicine
